# Patient Record
Sex: Female | Born: 1940 | Race: White | Hispanic: No | Marital: Married | Smoking: Never smoker
Health system: Southern US, Community
[De-identification: ages and names within clinical notes are randomized; demographics above are authoritative.]

## PROBLEM LIST (undated history)

## (undated) HISTORY — PX: ABDOMINAL HYSTERECTOMY: SHX81

## (undated) HISTORY — PX: BREAST BIOPSY: SHX20

---

## 1999-04-19 ENCOUNTER — Ambulatory Visit (HOSPITAL_BASED_OUTPATIENT_CLINIC_OR_DEPARTMENT_OTHER): Admission: RE | Admit: 1999-04-19 | Discharge: 1999-04-19 | Payer: Self-pay | Admitting: *Deleted

## 2000-02-21 ENCOUNTER — Encounter: Payer: Self-pay | Admitting: Family Medicine

## 2000-02-21 ENCOUNTER — Encounter: Admission: RE | Admit: 2000-02-21 | Discharge: 2000-02-21 | Payer: Self-pay | Admitting: Family Medicine

## 2000-04-10 ENCOUNTER — Other Ambulatory Visit: Admission: RE | Admit: 2000-04-10 | Discharge: 2000-04-10 | Payer: Self-pay | Admitting: Family Medicine

## 2001-03-12 ENCOUNTER — Encounter: Payer: Self-pay | Admitting: Family Medicine

## 2001-03-12 ENCOUNTER — Encounter: Admission: RE | Admit: 2001-03-12 | Discharge: 2001-03-12 | Payer: Self-pay | Admitting: Family Medicine

## 2003-05-26 ENCOUNTER — Encounter: Payer: Self-pay | Admitting: Family Medicine

## 2003-05-26 ENCOUNTER — Encounter: Admission: RE | Admit: 2003-05-26 | Discharge: 2003-05-26 | Payer: Self-pay | Admitting: Family Medicine

## 2003-06-16 ENCOUNTER — Other Ambulatory Visit: Admission: RE | Admit: 2003-06-16 | Discharge: 2003-06-16 | Payer: Self-pay | Admitting: Family Medicine

## 2003-09-11 ENCOUNTER — Encounter (INDEPENDENT_AMBULATORY_CARE_PROVIDER_SITE_OTHER): Payer: Self-pay | Admitting: Specialist

## 2003-09-11 ENCOUNTER — Ambulatory Visit (HOSPITAL_COMMUNITY): Admission: RE | Admit: 2003-09-11 | Discharge: 2003-09-11 | Payer: Self-pay | Admitting: Gastroenterology

## 2004-06-18 ENCOUNTER — Encounter: Admission: RE | Admit: 2004-06-18 | Discharge: 2004-06-18 | Payer: Self-pay | Admitting: Family Medicine

## 2004-08-13 ENCOUNTER — Other Ambulatory Visit: Admission: RE | Admit: 2004-08-13 | Discharge: 2004-08-13 | Payer: Self-pay | Admitting: Family Medicine

## 2005-08-19 ENCOUNTER — Encounter: Admission: RE | Admit: 2005-08-19 | Discharge: 2005-08-19 | Payer: Self-pay | Admitting: Family Medicine

## 2005-09-01 ENCOUNTER — Encounter: Admission: RE | Admit: 2005-09-01 | Discharge: 2005-09-01 | Payer: Self-pay | Admitting: Family Medicine

## 2006-08-17 ENCOUNTER — Other Ambulatory Visit: Admission: RE | Admit: 2006-08-17 | Discharge: 2006-08-17 | Payer: Self-pay | Admitting: Family Medicine

## 2006-08-31 ENCOUNTER — Encounter: Admission: RE | Admit: 2006-08-31 | Discharge: 2006-08-31 | Payer: Self-pay | Admitting: Family Medicine

## 2006-09-07 ENCOUNTER — Encounter: Admission: RE | Admit: 2006-09-07 | Discharge: 2006-09-07 | Payer: Self-pay | Admitting: Family Medicine

## 2008-02-04 ENCOUNTER — Encounter: Admission: RE | Admit: 2008-02-04 | Discharge: 2008-02-04 | Payer: Self-pay | Admitting: Family Medicine

## 2008-10-02 ENCOUNTER — Other Ambulatory Visit: Admission: RE | Admit: 2008-10-02 | Discharge: 2008-10-02 | Payer: Self-pay | Admitting: Family Medicine

## 2009-05-02 ENCOUNTER — Encounter: Admission: RE | Admit: 2009-05-02 | Discharge: 2009-05-02 | Payer: Self-pay | Admitting: Family Medicine

## 2010-07-01 ENCOUNTER — Encounter: Admission: RE | Admit: 2010-07-01 | Discharge: 2010-07-01 | Payer: Self-pay | Admitting: Family Medicine

## 2011-04-18 NOTE — Op Note (Signed)
   NAME:  Renee Rhodes, Renee Rhodes                          ACCOUNT NO.:  [id number]   MEDICAL RECORD NO.:  [id number]                   PATIENT TYPE:  AMB   LOCATION:  ENDO                                 FACILITY:  [facility]   PHYSICIAN:  Graylin Shiver, M.D.                DATE OF BIRTH:  10-31-1941   DATE OF PROCEDURE:  09/11/2003  DATE OF DISCHARGE:                                 OPERATIVE REPORT   PROCEDURE:  Colonoscopy with biopsy.   INDICATION FOR PROCEDURE:  Screening.   Informed consent was obtained after explanation of the risks of bleeding,  infection, and premedication.   PREMEDICATION:  Fentanyl 75 mcg IV, Versed 7 mg IV.   DESCRIPTION OF PROCEDURE:  With the patient in the left lateral decubitus  position, a rectal exam was performed and no masses were felt.  The Olympus  pediatric colonoscope was inserted into the rectum and advanced around the  colon to the cecum.  Cecal landmarks were identified.  The cecum and  ascending colon were normal.  At the level of the hepatic flexure there was  a fold that had a very slight granular appearance to it.  Initially I took a  biopsy forceps and rubbed across the fold, and the granularity flattened out  and disappeared but then as the fold came back, it was a slight granular  appearance.  Several biopsies of this fold were obtained for histologic  inspection.  I am unclear as to the significance of this at this time.  Biopsies will be checked.  The transverse colon looked normal.  The  descending colon and sigmoid revealed diverticulosis.  The rectum was  normal.  She tolerated the procedure well without complication.   IMPRESSION:  1. Diverticulosis.  2. Slight granularity of the fold at the level of the transverse colon,     biopsies were obtained for histology and will be checked.                                               Graylin Shiver, M.D.    SFG/MEDQ  D:  09/11/2003  T:  09/11/2003  Job:  [id number]   cc:   Gretta Arab.  Valentina Lucks, M.D.  [address] [place]  [place] [address]  Fax: [phone]

## 2011-07-11 ENCOUNTER — Other Ambulatory Visit: Payer: Self-pay | Admitting: Family Medicine

## 2011-07-11 DIAGNOSIS — Z1231 Encounter for screening mammogram for malignant neoplasm of breast: Secondary | ICD-10-CM

## 2011-07-24 ENCOUNTER — Ambulatory Visit
Admission: RE | Admit: 2011-07-24 | Discharge: 2011-07-24 | Disposition: A | Discharge status: Home / Self Care | Payer: Medicare Other | Source: Ambulatory Visit | Attending: Family Medicine | Admitting: Family Medicine

## 2011-07-24 DIAGNOSIS — Z1231 Encounter for screening mammogram for malignant neoplasm of breast: Secondary | ICD-10-CM

## 2012-08-24 ENCOUNTER — Other Ambulatory Visit: Payer: Self-pay | Admitting: Family Medicine

## 2012-08-24 DIAGNOSIS — Z1231 Encounter for screening mammogram for malignant neoplasm of breast: Secondary | ICD-10-CM

## 2012-09-09 ENCOUNTER — Ambulatory Visit
Admission: RE | Admit: 2012-09-09 | Discharge: 2012-09-09 | Disposition: A | Discharge status: Home / Self Care | Payer: 59 | Source: Ambulatory Visit | Attending: Family Medicine | Admitting: Family Medicine

## 2012-09-09 DIAGNOSIS — Z1231 Encounter for screening mammogram for malignant neoplasm of breast: Secondary | ICD-10-CM

## 2013-09-02 ENCOUNTER — Other Ambulatory Visit: Payer: Self-pay

## 2013-09-02 DIAGNOSIS — Z1231 Encounter for screening mammogram for malignant neoplasm of breast: Secondary | ICD-10-CM

## 2013-09-12 ENCOUNTER — Ambulatory Visit
Admission: RE | Admit: 2013-09-12 | Discharge: 2013-09-12 | Disposition: A | Discharge status: Home / Self Care | Payer: Medicare Other | Source: Ambulatory Visit

## 2013-09-12 DIAGNOSIS — Z1231 Encounter for screening mammogram for malignant neoplasm of breast: Secondary | ICD-10-CM

## 2013-10-18 ENCOUNTER — Other Ambulatory Visit: Payer: Self-pay | Admitting: Gastroenterology

## 2014-08-22 ENCOUNTER — Other Ambulatory Visit: Payer: Self-pay

## 2014-08-22 DIAGNOSIS — Z1231 Encounter for screening mammogram for malignant neoplasm of breast: Secondary | ICD-10-CM

## 2014-09-13 ENCOUNTER — Ambulatory Visit
Admission: RE | Admit: 2014-09-13 | Discharge: 2014-09-13 | Disposition: A | Discharge status: Home / Self Care | Payer: Medicare Other | Source: Ambulatory Visit

## 2014-09-13 ENCOUNTER — Encounter (INDEPENDENT_AMBULATORY_CARE_PROVIDER_SITE_OTHER): Payer: Self-pay

## 2014-09-13 DIAGNOSIS — Z1231 Encounter for screening mammogram for malignant neoplasm of breast: Secondary | ICD-10-CM

## 2015-01-02 ENCOUNTER — Emergency Department (HOSPITAL_COMMUNITY)
Admission: EM | Admit: 2015-01-02 | Discharge: 2015-01-02 | Disposition: A | Discharge status: Home / Self Care | Payer: Medicare Other | Source: Home / Self Care | Attending: Family Medicine | Admitting: Family Medicine

## 2015-01-02 ENCOUNTER — Encounter (HOSPITAL_COMMUNITY): Payer: Self-pay | Admitting: *Deleted

## 2015-01-02 ENCOUNTER — Emergency Department (INDEPENDENT_AMBULATORY_CARE_PROVIDER_SITE_OTHER): Payer: Medicare Other

## 2015-01-02 DIAGNOSIS — W19XXXA Unspecified fall, initial encounter: Secondary | ICD-10-CM

## 2015-01-02 DIAGNOSIS — S52121A Displaced fracture of head of right radius, initial encounter for closed fracture: Secondary | ICD-10-CM

## 2015-01-02 MED ORDER — HYDROCODONE-ACETAMINOPHEN 5-325 MG PO TABS: 1.0000 | ORAL_TABLET | Freq: Four times a day (QID) | ORAL | Status: DC | PRN

## 2015-01-02 MED ORDER — IBUPROFEN 800 MG PO TABS: 800.0000 mg | ORAL_TABLET | Freq: Three times a day (TID) | ORAL | Status: AC | PRN

## 2015-01-02 NOTE — ED Notes (Signed)
Pt  Renee Rhodes  Yesterday    And  Injured  Her  r  Arm      -  She  Has  Pain on  Palpation    And  Certain movements        She  Is  Alert  And  Oriented         Speaking in  Complete     sentances        Ice  Pack    Is   In place

## 2015-01-02 NOTE — ED Provider Notes (Signed)
CSN: [id number]     Arrival date & time 01/02/15  [id number] History   First MD Initiated Contact with Patient 01/02/15 [phone]     Chief Complaint  Patient presents with  . Arm Injury   (Consider location/radiation/quality/duration/timing/severity/associated sxs/prior Treatment) HPI   Patient here with complaints of right forearm pain. She explains that last night she stepped on a basketball in her living room and fell on her right forearm. She had some pain and swelling of the time which was helped by 800 mg of Motrin and ice. This morning she is continued to use ice and Motrin. She states that should difficulty with tasks this morning using her right hand including using soap dispenser, closing car door, and getting dressed. She has pain with movement and use of the right hand and feels that it's weak due to limitations of pain.  She denies Any head trauma or loss of consciousness. She also denies chest pain, dyspnea, fever, chills, or change in appetite or by mouth tolerance.  History reviewed. No pertinent past medical history. History reviewed. No pertinent past surgical history. History reviewed. No pertinent family history. History  Substance Use Topics  . Smoking status: Not on file  . Smokeless tobacco: Not on file  . Alcohol Use: Not on file   OB History    No data available     Review of Systems  Constitutional: Negative for fever and chills.  Eyes: Negative for visual disturbance.  Respiratory: Negative for shortness of breath.   Neurological: Negative for dizziness.    Allergies  Review of patient's allergies indicates no known allergies.  Home Medications   Prior to Admission medications   Not on File   BP 130/84 mmHg  Pulse 67  Temp(Src) 97.9 F (36.6 C) (Oral)  Resp 16  SpO2 97% Physical Exam  Constitutional: She appears well-developed and well-nourished. No distress.  HENT:  Head: Normocephalic and atraumatic.  Neck: Neck supple.  Cardiovascular: Normal  rate, regular rhythm and normal heart sounds.   No murmur heard. Normal pulse and right radial and ulnar arteries, brisk cap refill in right hand  Pulmonary/Chest: Effort normal and breath sounds normal. She has no wheezes.  Musculoskeletal: Normal range of motion.  Right forearm with no tenderness to palpation, pain in lateral and medial forearm with supination and pronation respectively, some mild pain in lateral portion of proximal forearm with flexion, full range of motion. No tenderness to palpation of any of the bony structures of the right hand, wrist, right forearm, or humerus.   Neurological:  Normal Sensation in right hand  Skin: She is not diaphoretic.    ED Course  Procedures (including critical care time) Labs Review Labs Reviewed - No data to display  Imaging Review Dg Forearm Right  01/02/2015   CLINICAL DATA:  Patient fell onto right forearm after tripping 1 day prior  EXAM: RIGHT FOREARM - 2 VIEW  COMPARISON:  None.  FINDINGS: Frontal and lateral views were obtained. There is an impacted fracture of the proximal radial metaphysis in near anatomic alignment. No other fracture. No dislocation. There is an elbow joint effusion. A small focus of calcification is seen in the volar aspect of the elbow joint. Incidental note is made of a minus ulnar variant.  IMPRESSION: Impacted fracture proximal radial metaphysis. No other fracture. No dislocation.   Electronically Signed   By: Bretta BangWilliam  Woodruff M.D.   On: 01/02/2015 09:58     MDM   1. Radial head fracture,  right, closed, initial encounter   2. Fall    74 year old female here with radial head fracture. Have discussed with he office of Dr. who will see her next Tuesday and asked that she placed in a posterior arms point here. Splint was applied by myself and Dr. Denyse Amass. Patient given ibuprofen and Norco for pain.   Pt seen and discussed with Dr. Artis Flock and he agrees with the plan as discussed above.      Renee Gamma,  MD 01/02/15 1040

## 2015-01-02 NOTE — ED Provider Notes (Signed)
CSN: [id number]     Arrival date & time 01/02/15  [id number] History   First MD Initiated Contact with Patient 01/02/15 [phone]     Chief Complaint  Patient presents with  . Arm Injury   (Consider location/radiation/quality/duration/timing/severity/associated sxs/prior Treatment) HPI  History reviewed. No pertinent past medical history. History reviewed. No pertinent past surgical history. History reviewed. No pertinent family history. History  Substance Use Topics  . Smoking status: Not on file  . Smokeless tobacco: Not on file  . Alcohol Use: Not on file   OB History    No data available     Review of Systems  Allergies  Review of patient's allergies indicates no known allergies.  Home Medications   Prior to Admission medications   Medication Sig Start Date End Date Taking? Authorizing Provider  HYDROcodone-acetaminophen (NORCO) 5-325 MG per tablet Take 1 tablet by mouth every 6 (six) hours as needed for moderate pain. 01/02/15   Elenora GammaSamuel L Bradshaw, MD  ibuprofen (ADVIL,MOTRIN) 800 MG tablet Take 1 tablet (800 mg total) by mouth every 8 (eight) hours as needed. 01/02/15   Elenora GammaSamuel L Bradshaw, MD   BP 130/84 mmHg  Pulse 67  Temp(Src) 97.9 F (36.6 C) (Oral)  Resp 16  SpO2 97% Physical Exam  ED Course  Procedures (including critical care time) Labs Review Labs Reviewed - No data to display  Imaging Review Dg Forearm Right  01/02/2015   CLINICAL DATA:  Patient fell onto right forearm after tripping 1 day prior  EXAM: RIGHT FOREARM - 2 VIEW  COMPARISON:  None.  FINDINGS: Frontal and lateral views were obtained. There is an impacted fracture of the proximal radial metaphysis in near anatomic alignment. No other fracture. No dislocation. There is an elbow joint effusion. A small focus of calcification is seen in the volar aspect of the elbow joint. Incidental note is made of a minus ulnar variant.  IMPRESSION: Impacted fracture proximal radial metaphysis. No other fracture. No  dislocation.   Electronically Signed   By: Bretta BangWilliam  Woodruff M.D.   On: 01/02/2015 09:58     MDM   1. Radial head fracture, right, closed, initial encounter   2. Fall    Pt seen and examined and x-ray reviewed with resident and plan reviewed.    Linna HoffJames D Kindl, MD 01/02/15 [phone]

## 2015-01-02 NOTE — ED Notes (Signed)
Med  r   Arm  Sling  applied

## 2015-01-02 NOTE — Discharge Instructions (Signed)

## 2015-01-29 ENCOUNTER — Emergency Department (HOSPITAL_COMMUNITY)
Admission: EM | Admit: 2015-01-29 | Discharge: 2015-01-29 | Disposition: A | Discharge status: Home / Self Care | Payer: Medicare Other | Source: Home / Self Care | Attending: Family Medicine | Admitting: Family Medicine

## 2015-01-29 ENCOUNTER — Encounter (HOSPITAL_COMMUNITY): Payer: Self-pay | Admitting: Family Medicine

## 2015-01-29 DIAGNOSIS — R6883 Chills (without fever): Secondary | ICD-10-CM

## 2015-01-29 DIAGNOSIS — S42301D Unspecified fracture of shaft of humerus, right arm, subsequent encounter for fracture with routine healing: Secondary | ICD-10-CM

## 2015-01-29 DIAGNOSIS — IMO0001 Reserved for inherently not codable concepts without codable children: Secondary | ICD-10-CM

## 2015-01-29 DIAGNOSIS — K219 Gastro-esophageal reflux disease without esophagitis: Secondary | ICD-10-CM

## 2015-01-29 NOTE — Discharge Instructions (Signed)
The cause of your symptoms is not immediately clear may be due to several factors - stress, healing right arm, and an early viral infection. Please continue your reflux medicine as needed to prevent heartburn. Please use the Motrin as needed for symptomatic chills or general ill feeling. Please come back for follow-up with your primary care physician if you do develop fevers and worsening chills, aches, pains. Please make sure to take plenty of time for yourself to help reduce stress.

## 2015-01-29 NOTE — ED Provider Notes (Signed)
CSN: [id number]     Arrival date & time 01/29/15  1102 History   First MD Initiated Contact with Patient 01/29/15 1222     Chief Complaint  Patient presents with  . Chills  . Shaking  . Gastrophageal Reflux   (Consider location/radiation/quality/duration/timing/severity/associated sxs/prior Treatment) HPI  Last night awoke at 04:30 with chills and whole body shaking. Took Motrin 800 and used more blankets and was able to get to sleep. Did not eat anything. Completely resolved by time pt awoke and pts husband told pt to come to [facility].   Developed burning in esophagus. Ate very late. Associated w/ cough. Did not take prilosec last night.   History reviewed. No pertinent past medical history. Past Surgical History  Procedure Laterality Date  . Abdominal hysterectomy     Family History  Problem Relation Age of Onset  . Dementia Mother   . Hypertension Father    History  Substance Use Topics  . Smoking status: Never Smoker   . Smokeless tobacco: Never Used  . Alcohol Use: No   OB History    No data available     Review of Systems Per HPI with all other pertinent systems negative.   Allergies  Review of patient's allergies indicates no known allergies.  Home Medications   Prior to Admission medications   Medication Sig Start Date End Date Taking? Authorizing Provider  atorvastatin (LIPITOR) 20 MG tablet Take 20 mg by mouth daily.   Yes Historical Provider, MD  calcium carbonate (OS-CAL) 1250 (500 CA) MG chewable tablet Chew 1 tablet by mouth daily.   Yes Historical Provider, MD  omeprazole (PRILOSEC) 10 MG capsule Take 10 mg by mouth daily.   Yes Historical Provider, MD  ibuprofen (ADVIL,MOTRIN) 800 MG tablet Take 1 tablet (800 mg total) by mouth every 8 (eight) hours as needed. 01/02/15   Elenora Gamma, MD   BP 143/73 mmHg  Pulse 77  Temp(Src) 97.8 F (36.6 C) (Oral)  Resp 16  SpO2 100% Physical Exam  Constitutional: She is oriented to person, place, and time. She  appears well-developed and well-nourished.  HENT:  Head: Normocephalic and atraumatic.  Eyes: EOM are normal. Pupils are equal, round, and reactive to light.  Neck: Normal range of motion.  Cardiovascular: Normal rate.   No murmur heard. Pulmonary/Chest: Effort normal and breath sounds normal.  Abdominal: Soft. Bowel sounds are normal.  Musculoskeletal:  Right arm held in flexed position. Hand with full range of motion and 4-5 grip strength. Left hand 5/5 grip strength in left arm with full range of motion. Radial pulses 2+ bilateral  Neurological: She is alert and oriented to person, place, and time.  Skin: Skin is warm.  Psychiatric: She has a normal mood and affect. Her behavior is normal.    ED Course  Procedures (including critical care time) Labs Review Labs Reviewed - No data to display  Imaging Review No results found.   MDM   1. Right arm fracture, with routine healing, subsequent encounter   2. Chills (without fever)   3. Reflux    Well-healing right arm fracture in continue follow-up with work.  Reflux esophagitis. Patient to resume normal PPI.  Single episode of chills shaking and general ill feeling. Resolved at time of visit. Symptom possibly multifactorial secondary to very brief or early viral illness, stress, acute inflammatory reaction secondary to right arm fracture and healing process. No further intervention at this time unless symptoms recur and worsen.  Precautions given and  all questions answered  Shelly Flattenavid Merrell, MD Family Medicine 01/29/2015, 12:42 PM      Ozella Rocksavid J Merrell, MD 01/29/15 [phone]

## 2015-01-29 NOTE — ED Notes (Signed)
Pt was experiencing extreme GERD last night that kept her awake.  She then started shaking all over and had chills.  She took some Ibuprofen and felt better.

## 2015-09-04 ENCOUNTER — Other Ambulatory Visit: Payer: Self-pay

## 2015-09-04 DIAGNOSIS — Z1231 Encounter for screening mammogram for malignant neoplasm of breast: Secondary | ICD-10-CM

## 2015-09-17 ENCOUNTER — Ambulatory Visit
Admission: RE | Admit: 2015-09-17 | Discharge: 2015-09-17 | Disposition: A | Discharge status: Home / Self Care | Payer: Medicare Other | Source: Ambulatory Visit

## 2015-09-17 DIAGNOSIS — Z1231 Encounter for screening mammogram for malignant neoplasm of breast: Secondary | ICD-10-CM

## 2016-09-19 ENCOUNTER — Other Ambulatory Visit: Payer: Self-pay | Admitting: Family Medicine

## 2016-09-19 DIAGNOSIS — Z1231 Encounter for screening mammogram for malignant neoplasm of breast: Secondary | ICD-10-CM

## 2016-09-30 ENCOUNTER — Ambulatory Visit
Admission: RE | Admit: 2016-09-30 | Discharge: 2016-09-30 | Disposition: A | Discharge status: Home / Self Care | Payer: Medicare Other | Source: Ambulatory Visit | Attending: Family Medicine | Admitting: Family Medicine

## 2016-09-30 DIAGNOSIS — Z1231 Encounter for screening mammogram for malignant neoplasm of breast: Secondary | ICD-10-CM

## 2017-09-28 ENCOUNTER — Other Ambulatory Visit: Payer: Self-pay | Admitting: Family Medicine

## 2017-09-28 DIAGNOSIS — Z139 Encounter for screening, unspecified: Secondary | ICD-10-CM

## 2017-10-27 ENCOUNTER — Ambulatory Visit
Admission: RE | Admit: 2017-10-27 | Discharge: 2017-10-27 | Disposition: A | Discharge status: Home / Self Care | Payer: Medicare Other | Source: Ambulatory Visit | Attending: Family Medicine | Admitting: Family Medicine

## 2017-10-27 DIAGNOSIS — Z139 Encounter for screening, unspecified: Secondary | ICD-10-CM

## 2018-11-30 ENCOUNTER — Ambulatory Visit
Admission: RE | Admit: 2018-11-30 | Discharge: 2018-11-30 | Disposition: A | Discharge status: Home / Self Care | Payer: Medicare Other | Source: Ambulatory Visit | Attending: Family Medicine | Admitting: Family Medicine

## 2018-11-30 ENCOUNTER — Other Ambulatory Visit: Payer: Self-pay | Admitting: Family Medicine

## 2018-11-30 DIAGNOSIS — Z1231 Encounter for screening mammogram for malignant neoplasm of breast: Secondary | ICD-10-CM

## 2018-12-02 ENCOUNTER — Other Ambulatory Visit: Payer: Self-pay | Admitting: Family Medicine

## 2018-12-02 DIAGNOSIS — R928 Other abnormal and inconclusive findings on diagnostic imaging of breast: Secondary | ICD-10-CM

## 2018-12-09 ENCOUNTER — Ambulatory Visit: Payer: Medicare Other

## 2018-12-09 ENCOUNTER — Ambulatory Visit
Admission: RE | Admit: 2018-12-09 | Discharge: 2018-12-09 | Disposition: A | Discharge status: Home / Self Care | Payer: Medicare Other | Source: Ambulatory Visit | Attending: Family Medicine | Admitting: Family Medicine

## 2018-12-09 DIAGNOSIS — R928 Other abnormal and inconclusive findings on diagnostic imaging of breast: Secondary | ICD-10-CM

## 2019-08-04 ENCOUNTER — Other Ambulatory Visit: Payer: Self-pay | Admitting: Family Medicine

## 2019-08-04 DIAGNOSIS — M810 Age-related osteoporosis without current pathological fracture: Secondary | ICD-10-CM

## 2019-10-06 ENCOUNTER — Other Ambulatory Visit: Payer: Self-pay | Admitting: Family Medicine

## 2019-10-06 DIAGNOSIS — I739 Peripheral vascular disease, unspecified: Secondary | ICD-10-CM

## 2019-10-10 ENCOUNTER — Ambulatory Visit
Admission: RE | Admit: 2019-10-10 | Discharge: 2019-10-10 | Disposition: A | Discharge status: Home / Self Care | Payer: Medicare Other | Source: Ambulatory Visit | Attending: Family Medicine | Admitting: Family Medicine

## 2019-10-10 DIAGNOSIS — I739 Peripheral vascular disease, unspecified: Secondary | ICD-10-CM

## 2019-10-13 ENCOUNTER — Other Ambulatory Visit: Payer: Self-pay

## 2019-10-13 ENCOUNTER — Ambulatory Visit
Admission: RE | Admit: 2019-10-13 | Discharge: 2019-10-13 | Disposition: A | Discharge status: Home / Self Care | Payer: Medicare Other | Source: Ambulatory Visit | Attending: Family Medicine | Admitting: Family Medicine

## 2019-10-13 DIAGNOSIS — M810 Age-related osteoporosis without current pathological fracture: Secondary | ICD-10-CM

## 2019-12-07 ENCOUNTER — Other Ambulatory Visit: Payer: Self-pay | Admitting: Family Medicine

## 2019-12-07 DIAGNOSIS — Z1231 Encounter for screening mammogram for malignant neoplasm of breast: Secondary | ICD-10-CM

## 2019-12-15 ENCOUNTER — Ambulatory Visit
Admission: RE | Admit: 2019-12-15 | Discharge: 2019-12-15 | Disposition: A | Discharge status: Home / Self Care | Payer: Medicare Other | Source: Ambulatory Visit | Attending: Family Medicine | Admitting: Family Medicine

## 2019-12-15 ENCOUNTER — Other Ambulatory Visit: Payer: Self-pay

## 2019-12-15 DIAGNOSIS — Z1231 Encounter for screening mammogram for malignant neoplasm of breast: Secondary | ICD-10-CM

## 2020-04-24 ENCOUNTER — Other Ambulatory Visit: Payer: Self-pay

## 2020-04-24 ENCOUNTER — Ambulatory Visit (INDEPENDENT_AMBULATORY_CARE_PROVIDER_SITE_OTHER): Payer: Medicare Other | Admitting: Podiatry

## 2020-04-24 DIAGNOSIS — Q828 Other specified congenital malformations of skin: Secondary | ICD-10-CM

## 2020-04-24 DIAGNOSIS — M21621 Bunionette of right foot: Secondary | ICD-10-CM | POA: Diagnosis not present

## 2020-04-24 DIAGNOSIS — B351 Tinea unguium: Secondary | ICD-10-CM

## 2020-04-24 DIAGNOSIS — Z79899 Other long term (current) drug therapy: Secondary | ICD-10-CM | POA: Diagnosis not present

## 2020-04-24 LAB — CBC WITH DIFFERENTIAL/PLATELET
Absolute Monocytes: 714 cells/uL (ref 200–950)
Basophils Absolute: 72 cells/uL (ref 0–200)
Basophils Relative: 1.2 %
Eosinophils Absolute: 192 cells/uL (ref 15–500)
Eosinophils Relative: 3.2 %
HCT: 41.6 % (ref 35.0–45.0)
Hemoglobin: 13.3 g/dL (ref 11.7–15.5)
Lymphs Abs: 1662 cells/uL (ref 850–3900)
MCH: 28.8 pg (ref 27.0–33.0)
MCHC: 32 g/dL (ref 32.0–36.0)
MCV: 90 fL (ref 80.0–100.0)
MPV: 9.8 fL (ref 7.5–12.5)
Monocytes Relative: 11.9 %
Neutro Abs: 3360 cells/uL (ref 1500–7800)
Neutrophils Relative %: 56 %
Platelets: 286 10*3/uL (ref 140–400)
RBC: 4.62 10*6/uL (ref 3.80–5.10)
RDW: 13.2 % (ref 11.0–15.0)
Total Lymphocyte: 27.7 %
WBC: 6 10*3/uL (ref 3.8–10.8)

## 2020-04-24 LAB — HEPATIC FUNCTION PANEL
AG Ratio: 1.9 (calc) (ref 1.0–2.5)
ALT: 19 U/L (ref 6–29)
AST: 19 U/L (ref 10–35)
Albumin: 4 g/dL (ref 3.6–5.1)
Alkaline phosphatase (APISO): 71 U/L (ref 37–153)
Bilirubin, Direct: 0.1 mg/dL (ref 0.0–0.2)
Globulin: 2.1 g/dL (calc) (ref 1.9–3.7)
Indirect Bilirubin: 0.2 mg/dL (calc) (ref 0.2–1.2)
Total Bilirubin: 0.3 mg/dL (ref 0.2–1.2)
Total Protein: 6.1 g/dL (ref 6.1–8.1)

## 2020-04-24 NOTE — Patient Instructions (Signed)
Terbinafine oral granules What is this medicine? TERBINAFINE (TER bin a feen) is an antifungal medicine. It is used to treat certain kinds of fungal or yeast infections. This medicine may be used for other purposes; ask your health care provider or pharmacist if you have questions. COMMON BRAND NAME(S): Lamisil What should I tell my health care provider before I take this medicine? They need to know if you have any of these conditions:  drink alcoholic beverages  kidney disease  liver disease  an unusual or allergic reaction to Terbinafine, other medicines, foods, dyes, or preservatives  pregnant or trying to get pregnant  breast-feeding How should I use this medicine? Take this medicine by mouth. Follow the directions on the prescription label. Hold packet with cut line on top. Shake packet gently to settle contents. Tear packet open along cut line, or use scissors to cut across line. Carefully pour the entire contents of packet onto a spoonful of a soft food, such as pudding or other soft, non-acidic food such as mashed potatoes (do NOT use applesauce or a fruit-based food). If two packets are required for each dose, you may either sprinkle the content of both packets on one spoonful of non-acidic food, or sprinkle the contents of both packets on two spoonfuls of non-acidic food. Make sure that no granules remain in the packet. Swallow the mxiture of the food and granules without chewing. Take your medicine at regular intervals. Do not take it more often than directed. Take all of your medicine as directed even if you think you are better. Do not skip doses or stop your medicine early. Contact your pediatrician or health care professional regarding the use of this medicine in children. While this medicine may be prescribed for children as young as 4 years for selected conditions, precautions do apply. Overdosage: If you think you have taken too much of this medicine contact a poison control  center or emergency room at once. NOTE: This medicine is only for you. Do not share this medicine with others. What if I miss a dose? If you miss a dose, take it as soon as you can. If it is almost time for your next dose, take only that dose. Do not take double or extra doses. What may interact with this medicine? Do not take this medicine with any of the following medications:  thioridazine This medicine may also interact with the following medications:  beta-blockers  caffeine  cimetidine  cyclosporine  MAOIs like Carbex, Eldepryl, Marplan, Nardil, and Parnate  medicines for fungal infections like fluconazole and ketoconazole  medicines for irregular heartbeat like amiodarone, flecainide and propafenone  rifampin  SSRIs like citalopram, escitalopram, fluoxetine, fluvoxamine, paroxetine and sertraline  tricyclic antidepressants like amitriptyline, clomipramine, desipramine, imipramine, nortriptyline, and others  warfarin This list may not describe all possible interactions. Give your health care provider a list of all the medicines, herbs, non-prescription drugs, or dietary supplements you use. Also tell them if you smoke, drink alcohol, or use illegal drugs. Some items may interact with your medicine. What should I watch for while using this medicine? Your doctor may monitor your liver function. Tell your doctor right away if you have nausea or vomiting, loss of appetite, stomach pain on your right upper side, yellow skin, dark urine, light stools, or are over tired. This medicine may cause serious skin reactions. They can happen weeks to months after starting the medicine. Contact your health care provider right away if you notice fevers or flu-like symptoms   with a rash. The rash may be red or purple and then turn into blisters or peeling of the skin. Or, you might notice a red rash with swelling of the face, lips or lymph nodes in your neck or under your arms. You need to take  this medicine for 6 weeks or longer to cure the fungal infection. Take your medicine regularly for as long as your doctor or health care provider tells you to. What side effects may I notice from receiving this medicine? Side effects that you should report to your doctor or health care professional as soon as possible:  allergic reactions like skin rash or hives, swelling of the face, lips, or tongue  change in vision  dark urine  fever or infection  general ill feeling or flu-like symptoms  light-colored stools  loss of appetite, nausea  rash, fever, and swollen lymph nodes  redness, blistering, peeling or loosening of the skin, including inside the mouth  right upper belly pain  unusually weak or tired  yellowing of the eyes or skin Side effects that usually do not require medical attention (report to your doctor or health care professional if they continue or are bothersome):  changes in taste  diarrhea  hair loss  muscle or joint pain  stomach upset This list may not describe all possible side effects. Call your doctor for medical advice about side effects. You may report side effects to FDA at 1-800-FDA-1088. Where should I keep my medicine? Keep out of the reach of children. Store at room temperature between 15 and 30 degrees C (59 and 86 degrees F). Throw away any unused medicine after the expiration date. NOTE: This sheet is a summary. It may not cover all possible information. If you have questions about this medicine, talk to your doctor, pharmacist, or health care provider.  2020 Elsevier/Gold Standard (2019-02-25 15:35:11)  

## 2020-05-01 ENCOUNTER — Other Ambulatory Visit: Payer: Self-pay | Admitting: Podiatry

## 2020-05-01 MED ORDER — TERBINAFINE HCL 250 MG PO TABS: 250.0000 mg | ORAL_TABLET | Freq: Every day | ORAL | 0 refills | Status: AC

## 2020-05-01 NOTE — Progress Notes (Signed)
Subjective:   Patient ID: Renee Rhodes, female   DOB: 79 y.o.   MRN: [id number]   HPI 79 year old female presents the office today for concerns of her toenails becoming discolored and occasional follow-up present on the past 2 years.  Denies any dark discoloration or any black streaks.  No redness or drainage or any signs of infection she reports.  She also gets a skin lesion on the right foot submetatarsal 5 and she points to which occasion becomes sharp.  Swelling about 6 months.  No recent injury.  No other concerns.   Review of Systems  All other systems reviewed and are negative.  No past medical history on file.  Past Surgical History:  Procedure Laterality Date  . ABDOMINAL HYSTERECTOMY    . BREAST BIOPSY Left    No Scar      Current Outpatient Medications:  .  alendronate (FOSAMAX) 70 MG tablet, Take 70 mg by mouth once a week., Disp: , Rfl:  .  ALPHAGAN P 0.1 % SOLN, , Disp: , Rfl:  .  atorvastatin (LIPITOR) 20 MG tablet, Take 20 mg by mouth daily., Disp: , Rfl:  .  calcium carbonate (OS-CAL) 1250 (500 CA) MG chewable tablet, Chew 1 tablet by mouth daily., Disp: , Rfl:  .  dorzolamide (TRUSOPT) 2 % ophthalmic solution, , Disp: , Rfl:  .  ibuprofen (ADVIL,MOTRIN) 800 MG tablet, Take 1 tablet (800 mg total) by mouth every 8 (eight) hours as needed., Disp: 20 tablet, Rfl: 0 .  omeprazole (PRILOSEC) 10 MG capsule, Take 10 mg by mouth daily., Disp: , Rfl:  .  SIMBRINZA 1-0.2 % SUSP, Instill 1 drop into both eyes twice a day, Disp: , Rfl:   Allergies  Allergen Reactions  . No Known Allergies          Objective:  Physical Exam  General: AAO x3, NAD  Dermatological: Nails appear mildly hypertrophic, dystrophic with yellow-brown discoloration.  There is no hyperpigmentation.  No open lesions.  Hyperkeratotic lesion right foot submetatarsal 5.  No ongoing ulceration drainage or foreign body or signs of infection.  Vascular: Dorsalis Pedis artery and Posterior  Tibial artery pedal pulses are 2/4 bilateral with immedate capillary fill time. There is no pain with calf compression, swelling, warmth, erythema.   Neruologic: Grossly intact via light touch bilateral.   Musculoskeletal: Tailors bunion present. Muscular strength 5/5 in all groups tested bilateral.  Gait: Unassisted, Nonantalgic.       Assessment:   79 year old female onychomycosis, hyperkeratotic lesion     Plan:  -Treatment options discussed including all alternatives, risks, and complications -Etiology of symptoms were discussed -Discussed treatment options for nail fungus.  After discussion she also proceed with oral therapy.  I ordered a CBC LFT to check prior to ordering Lamisil.  Discussed side effects. -Debrided hyperkeratotic tissue with any complications or bleeding.  Moisturizer and offloading.  Return in about 6 weeks (around 06/05/2020).  Vivi Barrack DPM

## 2020-05-02 ENCOUNTER — Telehealth: Payer: Self-pay | Admitting: *Deleted

## 2020-05-02 NOTE — Telephone Encounter (Signed)
-----   Message from Vivi Barrack, DPM sent at 05/01/2020  5:31 PM EDT ----- Misty Stanley- Please let her know that the blood work is normal and I have called in Lamisil for her. Thanks.

## 2020-05-02 NOTE — Telephone Encounter (Signed)
Called and left a message for the patient this morning and relayed the message per Dr Ardelle Anton and stated to call the office if any concerns or questions. Misty Stanley

## 2020-05-07 ENCOUNTER — Telehealth: Payer: Self-pay | Admitting: Podiatry

## 2020-05-07 NOTE — Telephone Encounter (Signed)
Pt was prescribed Oral Lamisil but her insurance will not cover it and is going to cost around $700. She is not able to afford the medication and would like to start the topical cream instead.   Please give patient a call to confirm the change.

## 2020-05-08 MED ORDER — NONFORMULARY OR COMPOUNDED ITEM: 5 refills | Status: AC

## 2020-05-08 NOTE — Telephone Encounter (Signed)
Left message informing pt I would send the orders for the topical to [place] [phone], they would process her insurance and call with coverage and delivery information. Faxed orders to [facility].

## 2020-05-08 NOTE — Addendum Note (Signed)
Addended by: Alphia Kava D on: 05/08/2020 01:57 PM   Modules accepted: Orders

## 2020-06-12 ENCOUNTER — Ambulatory Visit: Payer: Medicare Other | Admitting: Podiatry

## 2020-12-10 ENCOUNTER — Other Ambulatory Visit: Payer: Self-pay | Admitting: Family Medicine

## 2020-12-10 DIAGNOSIS — Z1231 Encounter for screening mammogram for malignant neoplasm of breast: Secondary | ICD-10-CM

## 2021-01-17 ENCOUNTER — Inpatient Hospital Stay: Admission: RE | Admit: 2021-01-17 | Payer: Medicare Other | Source: Ambulatory Visit

## 2021-03-08 ENCOUNTER — Other Ambulatory Visit: Payer: Self-pay

## 2021-03-08 ENCOUNTER — Ambulatory Visit
Admission: RE | Admit: 2021-03-08 | Discharge: 2021-03-08 | Disposition: A | Discharge status: Home / Self Care | Payer: Medicare Other | Source: Ambulatory Visit | Attending: Family Medicine | Admitting: Family Medicine

## 2021-03-08 DIAGNOSIS — Z1231 Encounter for screening mammogram for malignant neoplasm of breast: Secondary | ICD-10-CM

## 2021-04-15 ENCOUNTER — Ambulatory Visit (INDEPENDENT_AMBULATORY_CARE_PROVIDER_SITE_OTHER): Payer: Medicare Other | Admitting: Podiatry

## 2021-04-15 ENCOUNTER — Other Ambulatory Visit: Payer: Self-pay

## 2021-04-15 DIAGNOSIS — B351 Tinea unguium: Secondary | ICD-10-CM

## 2021-04-15 MED ORDER — EFINACONAZOLE 10 % EX SOLN: 1.0000 [drp] | Freq: Every day | CUTANEOUS | 11 refills | Status: AC

## 2021-04-15 NOTE — Patient Instructions (Signed)
Efinaconazole Topical Solution What is this medicine? EFINACONAZOLE (e FEE na KON a zole) is an antifungal medicine. It is used to treat certain kinds of fungal infections of the toenail. This medicine may be used for other purposes; ask your health care provider or pharmacist if you have questions. COMMON BRAND NAME(S): JUBLIA What should I tell my health care provider before I take this medicine? They need to know if you have any of these conditions:  an unusual or allergic reaction to efinaconazole, other medicines, foods, dyes or preservatives  pregnant or trying to get pregnant  breast-feeding How should I use this medicine? This medicine is for external use only. Do not take by mouth. Follow the directions on the label. Wash hands before and after use. Apply this medicine using the provided brush to cover the entire toenail. Do not use your medicine more often than directed. Finish the full course prescribed by your doctor or health care professional even if you think your condition is better. Do not stop using except on the advice of your doctor or health care professional. Talk to your pediatrician regarding the use of this medicine in children. While this drug may be prescribed for children as young as 6 years for selected conditions, precautions do apply. Overdosage: If you think you have taken too much of this medicine contact a poison control center or emergency room at once. NOTE: This medicine is only for you. Do not share this medicine with others. What if I miss a dose? If you miss a dose, use it as soon as you can. If it is almost time for your next dose, use only that dose. Do not use double or extra doses. What may interact with this medicine? Interactions have not been studied. Do not use any other nail products (i.e., nail polish, pedicures) during treatment with this medicine. This list may not describe all possible interactions. Give your health care provider a list of  all the medicines, herbs, non-prescription drugs, or dietary supplements you use. Also tell them if you smoke, drink alcohol, or use illegal drugs. Some items may interact with your medicine. What should I watch for while using this medicine? Do not get this medicine in your eyes. If you do, rinse out with plenty of cool tap water. Tell your doctor or health care professional if your symptoms do not start to get better or if they get worse. Wait for at least 10 minutes after bathing before applying this medication. After bathing, make sure that your feet are very dry. Fungal infections like moist conditions. Do not walk around barefoot. To help prevent reinfection, wear freshly washed cotton, not synthetic clothing. Tell your doctor or health care professional if you develop sores or blisters that do not heal properly. If your nail infection returns after you stop using this medicine, contact your doctor or health care professional. What side effects may I notice from receiving this medicine? Side effects that you should report to your doctor or health care professional as soon as possible:  allergic reactions like skin rash, itching or hives, swelling of the face, lips, or tongue  ingrown toenail Side effects that usually do not require medical attention (report to your doctor or health care professional if they continue or are bothersome):  mild skin irritation, burning, or itching This list may not describe all possible side effects. Call your doctor for medical advice about side effects. You may report side effects to FDA at 1-800-FDA-1088. Where should I   keep my medicine? Keep out of the reach of children. Store at room temperature between 20 and 25 degrees C (68 and 77 degrees F). Keep this medicine in the original container. Throw away any unused medicine after the expiration date. This medicine is flammable. Avoid exposure to heat, fire, flame, and smoking. NOTE: This sheet is a summary.  It may not cover all possible information. If you have questions about this medicine, talk to your doctor, pharmacist, or health care provider.  2021 Elsevier/Gold Standard (2019-03-28 16:14:11)  

## 2021-04-17 DIAGNOSIS — B351 Tinea unguium: Secondary | ICD-10-CM | POA: Insufficient documentation

## 2021-04-17 NOTE — Progress Notes (Signed)
Subjective: 80 year old female presents the office today for Evaluation of onychomycosis.  She was on Lamisil which helped minimally.  Nails are still discolored and asked about other treatment options.  Denies any redness or drainage of the toenail sites. Denies any systemic complaints such as fevers, chills, nausea, vomiting. No acute changes since last appointment, and no other complaints at this time.   Objective: AAO x3, NAD DP/PT pulses palpable bilaterally, CRT less than 3 seconds Overall the nails still appear to be hypertrophic, dystrophic, discolored with yellow-brown discoloration.  They are mildly hypertrophic.  No pain in the nails there is no redness or drainage or any signs of infection.  No open lesions. No pain with calf compression, swelling, warmth, erythema  Assessment: Onychomycosis  Plan: -All treatment options discussed with the patient including all alternatives, risks, complications.  -Discussed other treatment options other than oral Lamisil.  After discussion was proceed with Jublia which I ordered for her today.  Discussed side effects, success rates and duration of use. -Patient encouraged to call the office with any questions, concerns, change in symptoms.   Vivi Barrack DPM

## 2021-08-21 ENCOUNTER — Other Ambulatory Visit: Payer: Self-pay

## 2021-08-21 ENCOUNTER — Ambulatory Visit (INDEPENDENT_AMBULATORY_CARE_PROVIDER_SITE_OTHER): Payer: Medicare Other

## 2021-08-21 ENCOUNTER — Encounter: Payer: Self-pay | Admitting: Urgent Care

## 2021-08-21 ENCOUNTER — Ambulatory Visit
Admission: EM | Admit: 2021-08-21 | Discharge: 2021-08-21 | Disposition: A | Discharge status: Home / Self Care | Payer: Medicare Other | Attending: Urgent Care | Admitting: Urgent Care

## 2021-08-21 DIAGNOSIS — S62644A Nondisplaced fracture of proximal phalanx of right ring finger, initial encounter for closed fracture: Secondary | ICD-10-CM | POA: Diagnosis not present

## 2021-08-21 DIAGNOSIS — M79641 Pain in right hand: Secondary | ICD-10-CM | POA: Diagnosis not present

## 2021-08-21 DIAGNOSIS — M79644 Pain in right finger(s): Secondary | ICD-10-CM

## 2021-08-21 DIAGNOSIS — W19XXXA Unspecified fall, initial encounter: Secondary | ICD-10-CM

## 2021-08-21 MED ORDER — TRAMADOL HCL 50 MG PO TABS: 50.0000 mg | ORAL_TABLET | Freq: Four times a day (QID) | ORAL | 0 refills | Status: AC | PRN

## 2021-08-21 NOTE — Discharge Instructions (Addendum)
Make sure you make an appointment to see [facility] specialist at [facility].  If they are not able to see relatively soon then please make an appointment with a different hand specialist, I provided you with other practices to follow-up with.  Make sure you keep the splint on.  You can change out the Ace wrap if necessary.  Please just use Tylenol at a dose of 500mg -650mg  once every 6 hours as needed for your aches, pains, fevers. Do not use any nonsteroidal anti-inflammatories (NSAIDs) like ibuprofen, Motrin, naproxen, Aleve, etc. which are all available over-the-counter. You can use Tramadol for severe pain if Tylenol is not helping you.

## 2021-08-21 NOTE — ED Provider Notes (Signed)
[facility]   MRN: [id number] DOB: 19-Aug-1941  Subjective:   Renee Rhodes is a 80 y.o. female presenting for suffering a fall on an out-stretched right hand. The fall was accidental, was trying to feed her dog and tripped falling, absorbing the impact on her right hand. Denies head injury, loc, confusion, loss of sensation. Took 800mg  ibuprofen and came straight to our clinic.    No current facility-administered medications for this encounter.  Current Outpatient Medications:    alendronate (FOSAMAX) 70 MG tablet, Take 70 mg by mouth once a week., Disp: , Rfl:    ALPHAGAN P 0.1 % SOLN, , Disp: , Rfl:    atorvastatin (LIPITOR) 20 MG tablet, Take 20 mg by mouth daily., Disp: , Rfl:    calcium carbonate (OS-CAL) 1250 (500 CA) MG chewable tablet, Chew 1 tablet by mouth daily., Disp: , Rfl:    dorzolamide (TRUSOPT) 2 % ophthalmic solution, , Disp: , Rfl:    Efinaconazole 10 % SOLN, Apply 1 drop topically daily., Disp: 4 mL, Rfl: 11   Efinaconazole 10 % SOLN, Apply 1 drop topically daily., Disp: 4 mL, Rfl: 11   ibuprofen (ADVIL,MOTRIN) 800 MG tablet, Take 1 tablet (800 mg total) by mouth every 8 (eight) hours as needed., Disp: 20 tablet, Rfl: 0   NONFORMULARY OR COMPOUNDED ITEM, [facility]:  Antifungal topical - Terbinafine 3%, Fluconazole 2%, Tea Tree Oil 5%, Ibuprofen 2% in DMSO Suspension #33ml. Apply to the affected toenail(s) once (at bedtime) or twice daily., Disp: 30 each, Rfl: 5   omeprazole (PRILOSEC) 10 MG capsule, Take 10 mg by mouth daily., Disp: , Rfl:    SIMBRINZA 1-0.2 % SUSP, Instill 1 drop into both eyes twice a day, Disp: , Rfl:    terbinafine (LAMISIL) 250 MG tablet, Take 1 tablet (250 mg total) by mouth daily., Disp: 90 tablet, Rfl: 0   Allergies  Allergen Reactions   No Known Allergies     History reviewed. No pertinent past medical history.   Past Surgical History:  Procedure Laterality Date   ABDOMINAL HYSTERECTOMY     BREAST BIOPSY  Left    No Scar     Family History  Problem Relation Age of Onset   Dementia Mother    Hypertension Father    Breast cancer Neg Hx     Social History   Tobacco Use   Smoking status: Never   Smokeless tobacco: Never  Substance Use Topics   Alcohol use: No   Drug use: No    ROS   Objective:   Vitals: BP (!) 190/89 (BP Location: Left Arm)   Pulse 75   Temp 97.9 F (36.6 C) (Oral)   Resp 16   SpO2 96%   Physical Exam Constitutional:      General: She is not in acute distress.    Appearance: Normal appearance. She is well-developed. She is not ill-appearing, toxic-appearing or diaphoretic.  HENT:     Head: Normocephalic and atraumatic.     Nose: Nose normal.     Mouth/Throat:     Mouth: Mucous membranes are moist.     Pharynx: Oropharynx is clear.  Eyes:     General: No scleral icterus.       Right eye: No discharge.        Left eye: No discharge.     Extraocular Movements: Extraocular movements intact.     Conjunctiva/sclera: Conjunctivae normal.     Pupils: Pupils are equal, round, and reactive to  light.  Cardiovascular:     Rate and Rhythm: Normal rate.  Pulmonary:     Effort: Pulmonary effort is normal.  Musculoskeletal:       Hands:  Skin:    General: Skin is warm and dry.  Neurological:     General: No focal deficit present.     Mental Status: She is alert and oriented to person, place, and time.  Psychiatric:        Mood and Affect: Mood normal.        Behavior: Behavior normal.        Thought Content: Thought content normal.        Judgment: Judgment normal.    DG Hand Complete Right  Result Date: 08/21/2021 CLINICAL DATA:  Larey Seat and injured right hand today. EXAM: RIGHT HAND - COMPLETE 3+ VIEW COMPARISON:  None. FINDINGS: There is a nondisplaced oblique coursing fracture involving the proximal phalanx of the right ring finger. No intra-articular involvement is identified. No other fractures are identified. 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 IMPRESSION: Nondisplaced oblique coursing fracture involving the proximal phalanx of the ring finger. Electronically Signed   By: Rudie Meyer M.D.   On: 08/21/2021 19:21      Assessment and Plan :   PDMP not reviewed this encounter.  1. Finger pain, right   2. Closed nondisplaced fracture of proximal phalanx of right ring finger, initial encounter     Patient was placed into an ulnar gutter splint for her proximal phalanx fracture.  Recommended she wear it at all times, scheduled Tylenol for pain control.  Use tramadol for breakthrough pain.  Follow-up with hand specialist as soon as possible.  Provided her with information to multiple practices. Counseled patient on potential for adverse effects with medications prescribed/recommended today, ER and return-to-clinic precautions discussed, patient verbalized understanding.    Wallis Bamberg, [place] 08/21/21 1940

## 2021-08-21 NOTE — ED Triage Notes (Addendum)
Tripped over a ledge an hour ago, fell onto right side. Fall to outstretched right hand. Right ring finger swollen. Denies being on blood thinners daily, did take 800 mg ibuprofen before arrival. Denies head injury

## 2021-08-23 ENCOUNTER — Ambulatory Visit (INDEPENDENT_AMBULATORY_CARE_PROVIDER_SITE_OTHER): Payer: Medicare Other | Admitting: Orthopedic Surgery

## 2021-08-23 ENCOUNTER — Other Ambulatory Visit: Payer: Self-pay

## 2021-08-23 ENCOUNTER — Encounter: Payer: Self-pay | Admitting: Orthopedic Surgery

## 2021-08-23 DIAGNOSIS — S62614A Displaced fracture of proximal phalanx of right ring finger, initial encounter for closed fracture: Secondary | ICD-10-CM | POA: Diagnosis not present

## 2021-08-23 NOTE — H&P (View-Only) (Signed)
Office Visit Note   [name] [facility]           Date of Birth: November 25, 1941           MRN: [id number] Visit Date: 08/23/2021              Requested by: Maurice Small, MD [address] [place],  [place] [address] PCP: Maurice Small, MD   Assessment & Plan: Visit Diagnoses:  1. Closed displaced fracture of proximal phalanx of right ring finger, initial encounter     Plan: We discussed the diagnosis, prognosis, and both conservative and operative treatment options for her displaced, malrotated right ring finger proximal phalanx fracture.  After our discussion, the patient has elected to proceed with closed versus open reduction and percutaneous pinning.  We reviewed the benefits of surgery and the potential risks including, but not limited to, persistent symptoms, infection, damage to nearby nerves and blood vessels, delayed wound healing.    All patient concerns and questions were addressed.  A surgical date will be confirmed with the patient.    Follow-Up Instructions: No follow-ups on file.   Orders:  No orders of the defined types were placed in this encounter.  No orders of the defined types were placed in this encounter.     Procedures: No procedures performed   Clinical Data: No additional findings.   Subjective: No chief complaint on file.   This is a 80 yo RHD F who presents w/ a closed fracture of the right ring finger proximal phalanx after a GLF on 08/21/21.  She described pain and swelling involving the entire ring finger.  She has significant volar ecchymosis.  She denies pain elsewhere in her hand and denies numbness or paresthesias.    Review of Systems  Constitutional: Negative.   Respiratory: Negative.    Cardiovascular: Negative.   Skin: Negative.     Objective: Vital Signs: BP 134/83 (BP Location: Left Arm, Patient Position: Sitting, Cuff Size: Normal)   Pulse 70   Ht 5' (1.524 m)   Wt 150 lb 9.6 oz (68.3 kg)    SpO2 95%   BMI 29.41 kg/m   Physical Exam Cardiovascular:     Rate and Rhythm: Normal rate.     Pulses: Normal pulses.  Pulmonary:     Effort: Pulmonary effort is normal.  Skin:    General: Skin is warm and dry.     Capillary Refill: Capillary refill takes less than 2 seconds.     Findings: Bruising present.  Neurological:     General: No focal deficit present.     Mental Status: She is alert.    Right Hand Exam   Tenderness  Right hand tenderness location: TTP along ring finger centered over proximal phalanx.  Other  Sensation: normal Pulse: present  Comments:  Significant swelling and ecchymosis involving right ring finger.  Malrotation of finger with attempted ROM.      Specialty Comments:  No specialty comments available.  Imaging: 3V of the R hand from the ER demonstrates a displaced, oblique fracture through the ring finger proximal phalanx.   PMFS History: Patient Active Problem List   Diagnosis Date Noted   Closed displaced fracture of proximal phalanx of right ring finger 08/23/2021   Onychomycosis 04/17/2021   History reviewed. No pertinent past medical history.  Family History  Problem Relation Age of Onset   Dementia Mother    Hypertension Father    Breast cancer Neg Hx  Past Surgical History:  Procedure Laterality Date   ABDOMINAL HYSTERECTOMY     BREAST BIOPSY Left    No Scar    Social History   Occupational History   Not on file  Tobacco Use   Smoking status: Never   Smokeless tobacco: Never  Substance and Sexual Activity   Alcohol use: No   Drug use: No   Sexual activity: Not on file

## 2021-08-23 NOTE — Progress Notes (Signed)
Office Visit Note   [name] [facility]           Date of Birth: October 11, 1941           MRN: [id number] Visit Date: 08/23/2021              Requested by: Maurice Small, MD [address] [place],  [place] [address] PCP: Maurice Small, MD   Assessment & Plan: Visit Diagnoses:  1. Closed displaced fracture of proximal phalanx of right ring finger, initial encounter     Plan: We discussed the diagnosis, prognosis, and both conservative and operative treatment options for her displaced, malrotated right ring finger proximal phalanx fracture.  After our discussion, the patient has elected to proceed with closed versus open reduction and percutaneous pinning.  We reviewed the benefits of surgery and the potential risks including, but not limited to, persistent symptoms, infection, damage to nearby nerves and blood vessels, delayed wound healing.    All patient concerns and questions were addressed.  A surgical date will be confirmed with the patient.    Follow-Up Instructions: No follow-ups on file.   Orders:  No orders of the defined types were placed in this encounter.  No orders of the defined types were placed in this encounter.     Procedures: No procedures performed   Clinical Data: No additional findings.   Subjective: No chief complaint on file.   This is a 80 yo RHD F who presents w/ a closed fracture of the right ring finger proximal phalanx after a GLF on 08/21/21.  She described pain and swelling involving the entire ring finger.  She has significant volar ecchymosis.  She denies pain elsewhere in her hand and denies numbness or paresthesias.    Review of Systems  Constitutional: Negative.   Respiratory: Negative.    Cardiovascular: Negative.   Skin: Negative.     Objective: Vital Signs: BP 134/83 (BP Location: Left Arm, Patient Position: Sitting, Cuff Size: Normal)   Pulse 70   Ht 5' (1.524 m)   Wt 150 lb 9.6 oz (68.3 kg)    SpO2 95%   BMI 29.41 kg/m   Physical Exam Cardiovascular:     Rate and Rhythm: Normal rate.     Pulses: Normal pulses.  Pulmonary:     Effort: Pulmonary effort is normal.  Skin:    General: Skin is warm and dry.     Capillary Refill: Capillary refill takes less than 2 seconds.     Findings: Bruising present.  Neurological:     General: No focal deficit present.     Mental Status: She is alert.    Right Hand Exam   Tenderness  Right hand tenderness location: TTP along ring finger centered over proximal phalanx.  Other  Sensation: normal Pulse: present  Comments:  Significant swelling and ecchymosis involving right ring finger.  Malrotation of finger with attempted ROM.      Specialty Comments:  No specialty comments available.  Imaging: 3V of the R hand from the ER demonstrates a displaced, oblique fracture through the ring finger proximal phalanx.   PMFS History: Patient Active Problem List   Diagnosis Date Noted   Closed displaced fracture of proximal phalanx of right ring finger 08/23/2021   Onychomycosis 04/17/2021   History reviewed. No pertinent past medical history.  Family History  Problem Relation Age of Onset   Dementia Mother    Hypertension Father    Breast cancer Neg Hx  Past Surgical History:  Procedure Laterality Date   ABDOMINAL HYSTERECTOMY     BREAST BIOPSY Left    No Scar    Social History   Occupational History   Not on file  Tobacco Use   Smoking status: Never   Smokeless tobacco: Never  Substance and Sexual Activity   Alcohol use: No   Drug use: No   Sexual activity: Not on file

## 2021-08-27 ENCOUNTER — Encounter (HOSPITAL_BASED_OUTPATIENT_CLINIC_OR_DEPARTMENT_OTHER): Payer: Self-pay | Admitting: Orthopedic Surgery

## 2021-08-29 ENCOUNTER — Other Ambulatory Visit: Payer: Self-pay

## 2021-08-29 ENCOUNTER — Ambulatory Visit (HOSPITAL_BASED_OUTPATIENT_CLINIC_OR_DEPARTMENT_OTHER): Payer: Medicare Other | Admitting: Certified Registered"

## 2021-08-29 ENCOUNTER — Ambulatory Visit (HOSPITAL_BASED_OUTPATIENT_CLINIC_OR_DEPARTMENT_OTHER)
Admission: RE | Admit: 2021-08-29 | Discharge: 2021-08-29 | Disposition: A | Discharge status: Home / Self Care | Payer: Medicare Other | Attending: Orthopedic Surgery | Admitting: Orthopedic Surgery

## 2021-08-29 ENCOUNTER — Encounter (HOSPITAL_BASED_OUTPATIENT_CLINIC_OR_DEPARTMENT_OTHER): Payer: Self-pay | Admitting: Orthopedic Surgery

## 2021-08-29 ENCOUNTER — Encounter (HOSPITAL_BASED_OUTPATIENT_CLINIC_OR_DEPARTMENT_OTHER)
Admission: RE | Disposition: A | Discharge status: Home / Self Care | Payer: Self-pay | Source: Home / Self Care | Attending: Orthopedic Surgery

## 2021-08-29 ENCOUNTER — Telehealth: Payer: Self-pay | Admitting: Orthopedic Surgery

## 2021-08-29 DIAGNOSIS — S62614A Displaced fracture of proximal phalanx of right ring finger, initial encounter for closed fracture: Secondary | ICD-10-CM | POA: Diagnosis present

## 2021-08-29 DIAGNOSIS — X58XXXA Exposure to other specified factors, initial encounter: Secondary | ICD-10-CM | POA: Diagnosis not present

## 2021-08-29 HISTORY — PX: CLOSED REDUCTION FINGER WITH PERCUTANEOUS PINNING: SHX5612

## 2021-08-29 SURGERY — CLOSED REDUCTION, FINGER, WITH PERCUTANEOUS PINNING
Anesthesia: Monitor Anesthesia Care | Site: Finger | Laterality: Right

## 2021-08-29 MED ORDER — CEFAZOLIN SODIUM-DEXTROSE 2-4 GM/100ML-% IV SOLN
2.0000 g | INTRAVENOUS | Status: AC
  Administered 2021-08-29: 2 g via INTRAVENOUS

## 2021-08-29 MED ORDER — ONDANSETRON HCL 4 MG/2ML IJ SOLN: 4.0000 mg | Freq: Four times a day (QID) | INTRAMUSCULAR | Status: DC | PRN

## 2021-08-29 MED ORDER — FENTANYL CITRATE (PF) 100 MCG/2ML IJ SOLN
INTRAMUSCULAR | Status: AC
  Filled 2021-08-29: qty 2

## 2021-08-29 MED ORDER — FENTANYL CITRATE (PF) 100 MCG/2ML IJ SOLN
25.0000 ug | INTRAMUSCULAR | Status: DC | PRN
  Administered 2021-08-29 (×3): 50 ug via INTRAVENOUS

## 2021-08-29 MED ORDER — OXYCODONE HCL 5 MG/5ML PO SOLN: 5.0000 mg | Freq: Once | ORAL | Status: AC | PRN

## 2021-08-29 MED ORDER — FENTANYL CITRATE (PF) 100 MCG/2ML IJ SOLN
INTRAMUSCULAR | Status: DC | PRN
  Administered 2021-08-29 (×2): 50 ug via INTRAVENOUS

## 2021-08-29 MED ORDER — PROPOFOL 500 MG/50ML IV EMUL
INTRAVENOUS | Status: AC
  Filled 2021-08-29: qty 50

## 2021-08-29 MED ORDER — LIDOCAINE 2% (20 MG/ML) 5 ML SYRINGE
INTRAMUSCULAR | Status: AC
  Filled 2021-08-29: qty 5

## 2021-08-29 MED ORDER — OXYCODONE HCL 5 MG PO TABS: 5.0000 mg | ORAL_TABLET | Freq: Four times a day (QID) | ORAL | 0 refills | Status: AC | PRN

## 2021-08-29 MED ORDER — BUPIVACAINE HCL (PF) 0.25 % IJ SOLN
INTRAMUSCULAR | Status: DC | PRN
  Administered 2021-08-29: 4 mL

## 2021-08-29 MED ORDER — LACTATED RINGERS IV SOLN: INTRAVENOUS | Status: DC

## 2021-08-29 MED ORDER — PROPOFOL 10 MG/ML IV BOLUS
INTRAVENOUS | Status: AC
  Filled 2021-08-29: qty 20

## 2021-08-29 MED ORDER — OXYCODONE HCL 5 MG PO TABS
5.0000 mg | ORAL_TABLET | Freq: Once | ORAL | Status: AC | PRN
  Administered 2021-08-29: 5 mg via ORAL

## 2021-08-29 MED ORDER — PROPOFOL 500 MG/50ML IV EMUL
INTRAVENOUS | Status: DC | PRN
  Administered 2021-08-29: 125 ug/kg/min via INTRAVENOUS
  Administered 2021-08-29: 100 ug/kg/min via INTRAVENOUS

## 2021-08-29 MED ORDER — LIDOCAINE HCL (PF) 1 % IJ SOLN
INTRAMUSCULAR | Status: DC | PRN
  Administered 2021-08-29: 4 mL

## 2021-08-29 MED ORDER — LIDOCAINE HCL (CARDIAC) PF 100 MG/5ML IV SOSY
PREFILLED_SYRINGE | INTRAVENOUS | Status: DC | PRN
  Administered 2021-08-29 (×2): 40 mg via INTRAVENOUS

## 2021-08-29 MED ORDER — OXYCODONE HCL 5 MG PO TABS
ORAL_TABLET | ORAL | Status: AC
  Filled 2021-08-29: qty 1

## 2021-08-29 MED ORDER — CEFAZOLIN SODIUM-DEXTROSE 2-4 GM/100ML-% IV SOLN
INTRAVENOUS | Status: AC
  Filled 2021-08-29: qty 100

## 2021-08-29 MED ORDER — EPHEDRINE SULFATE 50 MG/ML IJ SOLN
INTRAMUSCULAR | Status: DC | PRN
  Administered 2021-08-29: 10 mg via INTRAVENOUS

## 2021-08-29 MED ORDER — PHENYLEPHRINE HCL (PRESSORS) 10 MG/ML IV SOLN
INTRAVENOUS | Status: DC | PRN
  Administered 2021-08-29: 80 ug via INTRAVENOUS
  Administered 2021-08-29 (×2): 40 ug via INTRAVENOUS

## 2021-08-29 MED ORDER — ONDANSETRON HCL 4 MG/2ML IJ SOLN
INTRAMUSCULAR | Status: AC
  Filled 2021-08-29: qty 2

## 2021-08-29 MED ORDER — EPHEDRINE 5 MG/ML INJ
INTRAVENOUS | Status: AC
  Filled 2021-08-29: qty 5

## 2021-08-29 MED ORDER — LIDOCAINE HCL (PF) 1 % IJ SOLN
INTRAMUSCULAR | Status: AC
  Filled 2021-08-29: qty 30

## 2021-08-29 MED ORDER — PHENYLEPHRINE 40 MCG/ML (10ML) SYRINGE FOR IV PUSH (FOR BLOOD PRESSURE SUPPORT)
PREFILLED_SYRINGE | INTRAVENOUS | Status: AC
  Filled 2021-08-29: qty 10

## 2021-08-29 MED ORDER — ONDANSETRON HCL 4 MG/2ML IJ SOLN
INTRAMUSCULAR | Status: DC | PRN
  Administered 2021-08-29: 4 mg via INTRAVENOUS

## 2021-08-29 SURGICAL SUPPLY — 47 items
APL PRP STRL LF DISP 70% ISPRP (MISCELLANEOUS) ×1
BLADE SURG 15 STRL LF DISP TIS (BLADE) ×1 IMPLANT
BLADE SURG 15 STRL SS (BLADE) ×2
BNDG CMPR 9X4 STRL LF SNTH (GAUZE/BANDAGES/DRESSINGS)
BNDG COHESIVE 1X5 TAN STRL LF (GAUZE/BANDAGES/DRESSINGS) IMPLANT
BNDG ELASTIC 3X5.8 VLCR STR LF (GAUZE/BANDAGES/DRESSINGS) ×2 IMPLANT
BNDG ESMARK 4X9 LF (GAUZE/BANDAGES/DRESSINGS) IMPLANT
BNDG GAUZE ELAST 4 BULKY (GAUZE/BANDAGES/DRESSINGS) ×2 IMPLANT
CAP PIN PROTECTOR ORTHO WHT (CAP) ×2 IMPLANT
CHLORAPREP W/TINT 26 (MISCELLANEOUS) ×2 IMPLANT
CORD BIPOLAR FORCEPS 12FT (ELECTRODE) IMPLANT
COVER BACK TABLE 60X90IN (DRAPES) ×2 IMPLANT
COVER MAYO STAND STRL (DRAPES) ×2 IMPLANT
CUFF TOURN SGL QUICK 18X4 (TOURNIQUET CUFF) IMPLANT
CUFF TOURN SGL QUICK 24 (TOURNIQUET CUFF)
CUFF TRNQT CYL 24X4X16.5-23 (TOURNIQUET CUFF) IMPLANT
DRAPE EXTREMITY T 121X128X90 (DISPOSABLE) ×2 IMPLANT
DRAPE OEC MINIVIEW 54X84 (DRAPES) ×2 IMPLANT
DRAPE SURG 17X23 STRL (DRAPES) ×2 IMPLANT
GAUZE SPONGE 4X4 12PLY STRL (GAUZE/BANDAGES/DRESSINGS) ×2 IMPLANT
GAUZE XEROFORM 1X8 LF (GAUZE/BANDAGES/DRESSINGS) ×2 IMPLANT
GLOVE SURG ENC MOIS LTX SZ7 (GLOVE) ×2 IMPLANT
GLOVE SURG POLYISO LF SZ6 (GLOVE) ×2 IMPLANT
GLOVE SURG UNDER POLY LF SZ6 (GLOVE) ×2 IMPLANT
GLOVE SURG UNDER POLY LF SZ7 (GLOVE) ×8 IMPLANT
GOWN STRL REUS W/ TWL LRG LVL3 (GOWN DISPOSABLE) ×3 IMPLANT
GOWN STRL REUS W/TWL LRG LVL3 (GOWN DISPOSABLE) ×6
GOWN STRL REUS W/TWL XL LVL3 (GOWN DISPOSABLE) ×2 IMPLANT
K-WIRE .035X4 (WIRE) ×2 IMPLANT
K-WIRE .045X4 (WIRE) ×4 IMPLANT
NEEDLE HYPO 25X1 1.5 SAFETY (NEEDLE) ×2 IMPLANT
NS IRRIG 1000ML POUR BTL (IV SOLUTION) ×2 IMPLANT
PACK BASIN DAY SURGERY FS (CUSTOM PROCEDURE TRAY) ×2 IMPLANT
PAD CAST 3X4 CTTN HI CHSV (CAST SUPPLIES) ×1 IMPLANT
PADDING CAST COTTON 3X4 STRL (CAST SUPPLIES) ×2
SLEEVE SCD COMPRESS KNEE MED (STOCKING) IMPLANT
SPLINT PLASTER CAST XFAST 4X15 (CAST SUPPLIES) ×10 IMPLANT
SPLINT PLASTER XTRA FAST SET 4 (CAST SUPPLIES) ×10
SUCTION FRAZIER HANDLE 10FR (MISCELLANEOUS) ×2
SUCTION TUBE FRAZIER 10FR DISP (MISCELLANEOUS) ×1 IMPLANT
SUT ETHILON 4 0 PS 2 18 (SUTURE) IMPLANT
SUT MNCRL AB 3-0 PS2 18 (SUTURE) IMPLANT
SUT VICRYL 4-0 PS2 18IN ABS (SUTURE) IMPLANT
SYR BULB EAR ULCER 3OZ GRN STR (SYRINGE) IMPLANT
SYR CONTROL 10ML LL (SYRINGE) ×2 IMPLANT
TOWEL GREEN STERILE FF (TOWEL DISPOSABLE) ×2 IMPLANT
UNDERPAD 30X36 HEAVY ABSORB (UNDERPADS AND DIAPERS) ×2 IMPLANT

## 2021-08-29 NOTE — Op Note (Signed)
   Date of Surgery: 08/29/2021  INDICATIONS: Renee Rhodes is a 80 y.o.-year-old female with a right ring finger proximal phalanx fracture.  Risks, benefits, and alternatives to surgery were discussed.  The Patient did consent to the procedure after extensive discussion.   PREOPERATIVE DIAGNOSIS: 1.  Closed right ring finger proximal phalanx fracture  POSTOPERATIVE DIAGNOSIS: Same.  PROCEDURE:  Closed reduction and percutaneous pinning of RIGHT ring finger proximal phalanx fracture   SURGEON: Audria Nine, M.D.  ASSIST:   ANESTHESIA:  general  IV FLUIDS AND URINE: See anesthesia.  ESTIMATED BLOOD LOSS: 5 mL.  IMPLANTS: 0.045 k-wire x 1, 0.035 k-wire x 1   DRAINS: None  COMPLICATIONS: see description of procedure.  DESCRIPTION OF PROCEDURE: The patient was met in the preoperative holding area where the surgical site was marked and the consent form was verified.  The patient was then taken to the operating room and transferred to the operating table.  All bony prominences were well padded.  A tourniquet was applied to the right upper extremity.  The operative extremity was prepped and draped in the usual and sterile fashion.  A formal time-out was performed to confirm that this was the correct patient, surgery, side, and site.   A local block was performed using 5cc of 1% plain lidocaine with 5cc of 0.25% plain marcaine.  Following appropriate analgesia, the fracture was reduced with longitudinal traction and a pointed reduction clamp.  With the fracture reduce on both AP and lateral views, I passed the first oblique k-wire in an antegrade fashion.  The wire was appropriately positioned on all views.  I then tried to pass a second 0.045 k-wire, however, her canal was quite small and this second wire malreduced the fracture.  I then took both wires out and re-reduced the fracture.  I then decided to pass a 0.045 k-wire in an antegrade fashion.  This was appropriately positioned on all  views.  I then passed an antegrade 0.035 k-wire.  The fracture was appropriately reduced on both the AP and lateral views.  The wires were then cut, bent, and dressed with xeroform and a pin cap.  She was then placed into a well-padded ulnar gutter splint.  She was then transferred to the postoperative bed.  All counts were correct x 2 at the end of the procedure.    POSTOPERATIVE PLAN: She will be discharged to home from PACU.  She will be given appropriate pain medication at discharge.  I will see her back in the office in two weeks for repeat imaging.   Audria Nine, MD 10:38 AM

## 2021-08-29 NOTE — Telephone Encounter (Signed)
Patient is s/p CLOSED REDUCTION FINGER WITH PERCUTANEOUS PINNING RIGHT RING FINGER preformed on 08/29/2021

## 2021-08-29 NOTE — Transfer of Care (Signed)
Immediate Anesthesia Transfer of Care Note  [name]  Procedure(s) Performed: CLOSED REDUCTION FINGER WITH PERCUTANEOUS PINNING RIGHT RING FINGER (Right: Finger)  Patient Location: PACU  Anesthesia Type:MAC  Level of Consciousness: drowsy  Airway & Oxygen Therapy: Patient Spontanous Breathing and Patient connected to face mask oxygen  Post-op Assessment: Report given to RN and Post -op Vital signs reviewed and stable  Post vital signs: Reviewed and stable  Last Vitals:  Vitals Value Taken Time  BP 153/71 08/29/21 1033  Temp    Pulse 83 08/29/21 1035  Resp 15 08/29/21 1035  SpO2 100 % 08/29/21 1035  Vitals shown include unvalidated device data.  Last Pain:  Vitals:   08/29/21 0755  TempSrc: Oral  PainSc: 1       Patients Stated Pain Goal: 4 (21/19/41 7408)  Complications: No notable events documented.

## 2021-08-29 NOTE — Discharge Instructions (Addendum)
Post Anesthesia Home Care Instructions  Activity: Get plenty of rest for the remainder of the day. A responsible individual must stay with you for 24 hours following the procedure.  For the next 24 hours, DO NOT: -Drive a car -Advertising copywriter -Drink alcoholic beverages -Take any medication unless instructed by your physician -Make any legal decisions or sign important papers.  Meals: Start with liquid foods such as gelatin or soup. Progress to regular foods as tolerated. Avoid greasy, spicy, heavy foods. If nausea and/or vomiting occur, drink only clear liquids until the nausea and/or vomiting subsides. Call your physician if vomiting continues.  Special Instructions/Symptoms: Your throat may feel dry or sore from the anesthesia or the breathing tube placed in your throat during surgery. If this causes discomfort, gargle with warm salt water. The discomfort should disappear within 24 hours.  If you had a scopolamine patch placed behind your ear for the management of post- operative nausea and/or vomiting:  1. The medication in the patch is effective for 72 hours, after which it should be removed.  Wrap patch in a tissue and discard in the trash. Wash hands thoroughly with soap and water. 2. You may remove the patch earlier than 72 hours if you experience unpleasant side effects which may include dry mouth, dizziness or visual disturbances. 3. Avoid touching the patch. Wash your hands with soap and water after contact with the patch.         Waylan Rocher, M.D. Hand Surgery  POST-OPERATIVE DISCHARGE INSTRUCTIONS   PRESCRIPTIONS: You have been given a prescription to be taken as directed for post-operative pain control.  You may also take over the counter ibuprofen/aleve and tylenol for pain. Take this as directed on the packaging. Do not exceed 3000 mg tylenol/acetaminophen in 24 hours.  Ibuprofen 600-800 mg (3-4) tablets by mouth every 6 hours as needed for pain.   OR Aleve 2 tablets by mouth every 12 hours (twice daily) as needed for pain.  AND/OR Tylenol 1000 mg (2 tablets) every 8 hours as needed for pain.  Please use your pain medication carefully, as refills are limited and you may not be provided with one.  As stated above, please use over the counter pain medicine - it will also be helpful with decreasing your swelling.    ANESTHESIA: After your surgery, post-surgical discomfort or pain is likely. This discomfort can last several days to a few weeks. At certain times of the day your discomfort may be more intense.   Did you receive a nerve block?  A nerve block can provide pain relief for one hour to two days after your surgery. As long as the nerve block is working, you will experience little or no sensation in the area the surgeon operated on.  As the nerve block wears off, you will begin to experience pain or discomfort. It is very important that you begin taking your prescribed pain medication before the nerve block fully wears off. Treating your pain at the first sign of the block wearing off will ensure your pain is better controlled and more tolerable when full-sensation returns. Do not wait until the pain is intolerable, as the medicine will be less effective. It is better to treat pain in advance than to try and catch up.   General Anesthesia:  If you did not receive a nerve block during your surgery, you will need to start taking your pain medication shortly after your surgery and should continue to do so as prescribed by  your surgeon.     ICE AND ELEVATION: You may use ice for the first 48-72 hours, but it is not critical.   Motion of your fingers is very important s to decrease the swelling. Please follow the finger range of motion exercises below to assist you in regaining your motion. This should be done at least 10 repetitions 3-4 times a day.  Elevation, as much as possible for the next 48 hours, is critical for decreasing  swelling as well as for pain relief. Elevation means when you are seated or lying down, you hand should be at or above your heart. When walking, the hand needs to be at or above the level of your elbow.  If the bandage gets too tight, it may need to be loosened. Please contact our office and we will instruct you in how to do this.    SURGICAL BANDAGES:  Keep your dressing and/or splint clean and dry at all times.  Do not remove until you are seen again in the office.  If careful, you may place a plastic bag over your bandage and tape the end to shower, but be careful, do not get your bandages wet.     HAND THERAPY:  You may not need any. If you do, we will begin this at your follow up visit in the clinic.    ACTIVITY AND WORK: You are encouraged to move any fingers which are not in the bandage. Attached is an instruction sheet on finger motion. Do this as much as you need to make your fingers move fully and keep the swelling down.  Light use of the fingers is allowed to assist the other hand with daily hygiene and eating, but strong gripping or lifting is often uncomfortable and should be avoided.  You might miss a variable period of time from work and hopefully this issue has been discussed prior to surgery. You may not do any heavy work with your affected hand for about 2 weeks.    [facility] [address] [place],  [place]  [address] [phone]

## 2021-08-29 NOTE — Anesthesia Preprocedure Evaluation (Signed)
Anesthesia Evaluation  Patient identified by MRN, date of birth, ID band Patient awake    Reviewed: Allergy & Precautions, H&P , NPO status , Patient's Chart, lab work & pertinent test results  Airway Mallampati: II   Neck ROM: full    Dental   Pulmonary neg pulmonary ROS,    breath sounds clear to auscultation       Cardiovascular negative cardio ROS   Rhythm:regular Rate:Normal     Neuro/Psych    GI/Hepatic   Endo/Other    Renal/GU      Musculoskeletal   Abdominal   Peds  Hematology   Anesthesia Other Findings   Reproductive/Obstetrics                             Anesthesia Physical Anesthesia Plan  ASA: 2  Anesthesia Plan: MAC   Post-op Pain Management:    Induction: Intravenous  PONV Risk Score and Plan: 2 and Propofol infusion, Treatment may vary due to age or medical condition and Ondansetron  Airway Management Planned: Simple Face Mask  Additional Equipment:   Intra-op Plan:   Post-operative Plan:   Informed Consent: I have reviewed the patients History and Physical, chart, labs and discussed the procedure including the risks, benefits and alternatives for the proposed anesthesia with the patient or authorized representative who has indicated his/her understanding and acceptance.     Dental advisory given  Plan Discussed with: CRNA, Anesthesiologist and Surgeon  Anesthesia Plan Comments:         Anesthesia Quick Evaluation

## 2021-08-29 NOTE — Interval H&P Note (Signed)
History and Physical Interval Note:  08/29/2021 8:51 AM  [facility]  has presented today for surgery, with the diagnosis of Right Ring Finger Proximal Phalanx Fracture.  The various methods of treatment have been discussed with the patient and family. After consideration of risks, benefits and other options for treatment, the patient has consented to  Procedure(s): CLOSED REDUCTION FINGER WITH PERCUTANEOUS PINNING RIGHT RING FINGER (Right) as a surgical intervention.  The patient's history has been reviewed, patient examined, no change in status, stable for surgery.  I have reviewed the patient's chart and labs.  Questions were answered to the patient's satisfaction.     Hatice Bubel Rosslyn Pasion

## 2021-08-29 NOTE — Anesthesia Postprocedure Evaluation (Signed)
Anesthesia Post Note  [name] [facility]  Procedure(s) Performed: CLOSED REDUCTION FINGER WITH PERCUTANEOUS PINNING RIGHT RING FINGER (Right: Finger)     Patient location during evaluation: PACU Anesthesia Type: MAC Level of consciousness: awake and alert Pain management: pain level controlled Vital Signs Assessment: post-procedure vital signs reviewed and stable Respiratory status: spontaneous breathing, nonlabored ventilation, respiratory function stable and patient connected to nasal cannula oxygen Cardiovascular status: stable and blood pressure returned to baseline Postop Assessment: no apparent nausea or vomiting Anesthetic complications: no   No notable events documented.  Last Vitals:  Vitals:   08/29/21 1130 08/29/21 1152  BP: (!) 148/61 (!) 145/63  Pulse: (!) 52 62  Resp: 14 14  Temp:  36.4 C  SpO2: 96% 98%    Last Pain:  Vitals:   08/29/21 1139  TempSrc:   PainSc: [place]

## 2021-08-29 NOTE — Telephone Encounter (Signed)
Patient called advised the pharmacy would not fill the Rx for pain medication until they get prior approval. Patient asked what can she take in the meantime for pain? The number to contact patient is [phone]

## 2021-08-29 NOTE — Brief Op Note (Signed)
08/29/2021  10:36 AM  PATIENT:  Renee Rhodes  80 y.o. female  PRE-OPERATIVE DIAGNOSIS:  Right Ring Finger Proximal Phalanx Fracture  POST-OPERATIVE DIAGNOSIS:  Right Ring Finger Proximal Phalanx Fracture  PROCEDURE:  Procedure(s): CLOSED REDUCTION FINGER WITH PERCUTANEOUS PINNING RIGHT RING FINGER (Right)  SURGEON:  Surgeon(s) and Role:    * Marlyne Beards, MD - Primary  PHYSICIAN ASSISTANT:   ASSISTANTS: none   ANESTHESIA:   local  EBL:  Minimal   BLOOD ADMINISTERED:none  DRAINS: none   LOCAL MEDICATIONS USED:  MARCAINE     SPECIMEN:  No Specimen  DISPOSITION OF SPECIMEN:  N/A  COUNTS:  YES  TOURNIQUET:  * Missing tourniquet times found for documented tourniquets in log: [id number] *  DICTATION: .Dragon Dictation  PLAN OF CARE: Discharge to home after PACU  PATIENT DISPOSITION:  PACU - hemodynamically stable.   Delay start of Pharmacological VTE agent (>24hrs) due to surgical blood loss or risk of bleeding: no

## 2021-08-30 ENCOUNTER — Encounter (HOSPITAL_BASED_OUTPATIENT_CLINIC_OR_DEPARTMENT_OTHER): Payer: Self-pay | Admitting: Orthopedic Surgery

## 2021-09-12 ENCOUNTER — Ambulatory Visit (INDEPENDENT_AMBULATORY_CARE_PROVIDER_SITE_OTHER): Payer: Medicare Other | Admitting: Orthopedic Surgery

## 2021-09-12 ENCOUNTER — Other Ambulatory Visit: Payer: Self-pay

## 2021-09-12 ENCOUNTER — Encounter: Payer: Self-pay | Admitting: Orthopedic Surgery

## 2021-09-12 ENCOUNTER — Ambulatory Visit (INDEPENDENT_AMBULATORY_CARE_PROVIDER_SITE_OTHER): Payer: Medicare Other

## 2021-09-12 ENCOUNTER — Encounter: Payer: Self-pay | Admitting: Occupational Therapy

## 2021-09-12 ENCOUNTER — Ambulatory Visit (INDEPENDENT_AMBULATORY_CARE_PROVIDER_SITE_OTHER): Payer: Medicare Other | Admitting: Occupational Therapy

## 2021-09-12 VITALS — BP 124/80 | HR 65 | Ht 60.0 in | Wt 148.4 lb

## 2021-09-12 DIAGNOSIS — M25641 Stiffness of right hand, not elsewhere classified: Secondary | ICD-10-CM | POA: Diagnosis not present

## 2021-09-12 DIAGNOSIS — R278 Other lack of coordination: Secondary | ICD-10-CM | POA: Diagnosis not present

## 2021-09-12 DIAGNOSIS — R601 Generalized edema: Secondary | ICD-10-CM

## 2021-09-12 DIAGNOSIS — Z9889 Other specified postprocedural states: Secondary | ICD-10-CM

## 2021-09-12 DIAGNOSIS — M25541 Pain in joints of right hand: Secondary | ICD-10-CM | POA: Diagnosis not present

## 2021-09-12 DIAGNOSIS — S62614A Displaced fracture of proximal phalanx of right ring finger, initial encounter for closed fracture: Secondary | ICD-10-CM

## 2021-09-12 DIAGNOSIS — M79641 Pain in right hand: Secondary | ICD-10-CM | POA: Diagnosis not present

## 2021-09-12 DIAGNOSIS — M6281 Muscle weakness (generalized): Secondary | ICD-10-CM

## 2021-09-12 NOTE — Patient Instructions (Addendum)
WEARING SCHEDULE:  Wear splint at ALL times except for hygiene care (May remove splint for exercises and then immediately place back on ONLY if directed by the therapist)  PURPOSE:  To prevent movement and for protection until injury can heal  CARE OF SPLINT:  Keep splint away from heat sources including: stove, radiator or furnace, or a car in sunlight. The splint can melt and will no longer fit you properly  Keep away from pets and children  Clean the splint with rubbing alcohol as needed. * During this time, make sure you also clean your hand/arm as instructed by your therapist and/or perform dressing changes as needed. Then dry hand/arm completely before replacing splint. (When cleaning hand/arm, keep it immobilized in same position until splint is replaced)  PRECAUTIONS/POTENTIAL PROBLEMS: *If you notice or experience increased pain, swelling, numbness, or a lingering reddened area from the splint: Contact your therapist immediately by calling [phone] (option 4).You must wear the splint for protection, but we will get you scheduled for adjustments as quickly as possible.  (If only straps or hooks need to be replaced and NO adjustments to the splint need to be made, just call the office ahead and let them know you are coming in)  If you have any medical concerns or signs of infection, please call your doctor immediately   DIP Flexion (Active Blocked). Keep your back knuckles flexed in the same position that they are in your protective splint (MCP flexion with IP extension):    Hold __ring____ finger firmly at the middle so that only the tip joint can bend. Hold _3-5___ seconds. Repeat __5-10__ times. Do __2-3__ sessions per day. Then put splint right back on.  Copyright  V[employer]. All rights reserved.

## 2021-09-12 NOTE — Progress Notes (Addendum)
   Post-Op Visit Note   [name]           Date of Birth: 1941-08-06           MRN: [id number] Visit Date: 09/12/2021 PCP: Maurice Small, MD   Assessment & Plan:  Chief Complaint:  Chief Complaint  Patient presents with   Right Ring Finger - Routine Post Op   Visit Diagnoses:  1. Post-operative state   2. Closed displaced fracture of proximal phalanx of right ring finger, initial encounter     Plan: Discussed with patient that although there is some translation at the fracture site, the lateral view shows acceptable alignment and there is no clinical malrotation of the finger.  She has predictable stiffness of this finger.  Her pin sites remain clean and dry. The retrograde pin is advanced too far and into the MP joint.  The pin was backed out a bit.  We will keep the pins in for two more weeks at which time we will start therapy to work on ROM of the MP, PIP, and DIP joints.   Follow-Up Instructions: No follow-ups on file.   Orders:  Orders Placed This Encounter  Procedures   XR Finger Ring Right   Ambulatory referral to Occupational Therapy   No orders of the defined types were placed in this encounter.   Imaging: No results found.  PMFS History: Patient Active Problem List   Diagnosis Date Noted   Closed displaced fracture of proximal phalanx of right ring finger 08/23/2021   Onychomycosis 04/17/2021   History reviewed. No pertinent past medical history.  Family History  Problem Relation Age of Onset   Dementia Mother    Hypertension Father    Breast cancer Neg Hx     Past Surgical History:  Procedure Laterality Date   ABDOMINAL HYSTERECTOMY     BREAST BIOPSY Left    No Scar    CLOSED REDUCTION FINGER WITH PERCUTANEOUS PINNING Right 08/29/2021   Procedure: CLOSED REDUCTION FINGER WITH PERCUTANEOUS PINNING RIGHT RING FINGER;  Surgeon: Marlyne Beards, MD;  Location: [facility];  Service: Orthopedics;  Laterality: Right;    Social History   Occupational History   Not on file  Tobacco Use   Smoking status: Never   Smokeless tobacco: Never  Substance and Sexual Activity   Alcohol use: No   Drug use: No   Sexual activity: Not on file

## 2021-09-12 NOTE — Therapy (Signed)
[facility] Physical Therapy [address] [place], [place], [address] Phone: [phone]   Fax:  [phone]  Occupational Therapy Evaluation  Patient Details  Name: Roselind Klus [facility] MRN: [id number] Date of Birth: 12/27/1940 Referring Provider (OT): Dr Frazier Butt   Encounter Date: 09/12/2021   OT End of Session - 09/12/21 1501     Visit Number 1    Number of Visits 13   Eval +12 visits   Date for OT Re-Evaluation 10/24/21   Every 10 visits   Authorization Type [facility] Medicare    Authorization - Visit Number 1   Eval 09/12/21   Progress Note Due on Visit 10    OT Start Time 1045    OT Stop Time 1123    OT Time Calculation (min) 38 min    Activity Tolerance Patient tolerated treatment well;No increased pain    Behavior During Therapy [facility] for tasks assessed/performed             History reviewed. No pertinent past medical history.  Past Surgical History:  Procedure Laterality Date   ABDOMINAL HYSTERECTOMY     BREAST BIOPSY Left    No Scar    CLOSED REDUCTION FINGER WITH PERCUTANEOUS PINNING Right 08/29/2021   Procedure: CLOSED REDUCTION FINGER WITH PERCUTANEOUS PINNING RIGHT RING FINGER;  Surgeon: Marlyne Beards, MD;  Location: [facility];  Service: Orthopedics;  Laterality: Right;    There were no vitals filed for this visit.   Subjective Assessment - 09/12/21 1437     Subjective  Pt reports that she fell sustaining a right RF proximal phalanx fracture. She underwent percutaneous pinning to her R RF on 08/29/21 and she is now 2 weeks post-op. She presents per Dr Frazier Butt for custom ulnar gutter splinting and HEP.    Pertinent History No significant PMH noted per chart review    Patient Stated Goals Decreased pain, get use of hand/finger back    Currently in Pain? Yes    Pain Score 4     Pain Location Hand    Pain Orientation Right    Pain Descriptors / Indicators Discomfort;Aching    Pain Type Surgical pain    Pain Radiating  Towards At pin sites R RF    Pain Onset 1 to 4 weeks ago    Pain Frequency Intermittent    Aggravating Factors  Pin/hand movement    Pain Relieving Factors rest, elevation    Multiple Pain Sites No               [facility] OT Assessment - 09/12/21 0001       Assessment   Medical Diagnosis R RF Proximal phalanx fracture s/p pinning    Referring Provider (OT) Dr Frazier Butt    Onset Date/Surgical Date 08/29/21    Hand Dominance Right    Next MD Visit 2 weeks    Prior Therapy None      Precautions   Precautions Other (comment)   As per fracture healing s/p pinning proximal phalanx   Required Braces or Orthoses Other Brace/Splint   Custom forearm splinting     Balance Screen   Has the patient fallen in the past 6 months Yes    How many times? 1    Has the patient had a decrease in activity level because of a fear of falling?  No    Is the patient reluctant to leave their home because of a fear of falling?  No      Home  Environment  Family/patient expects to be discharged to: Private residence    Living Arrangements --   Family   Lives With Spouse   Family, daughter lives nearby     Prior Function   Level of Independence Independent    Vocation Retired    Leisure Has yorkie/dog   about 23 year old     ADL   ADL comments Pt reports that husband/family assist w/ ADL, opening, lifting and ADL's. Pt is not currently driving      Mobility   Mobility Status Independent      Written Expression   Dominant Hand Right      Cognition   Overall Cognitive Status Within Functional Limits for tasks assessed      Observation/Other Assessments   Observations Pt presents to clinic as walk in per Dr Frazier Butt. She has 2 pins in her right RF following R RF proximal phalanx fx. DOS was 08/29/21, she is now 2 weeks post op. She has min-mod edema noted in her R RF/small noted as compared visually to her left non-injured hand    Skin Integrity Pins x2 prox phalanx R RF      Sensation    Additional Comments NT but pt denies paresthesias at this time      Coordination   Gross Motor Movements are Fluid and Coordinated --   Shoulder, elbow WFL's   Fine Motor Movements are Fluid and Coordinated Not tested   NT secondary to pins in RRF proximal phalanx   Coordination Impaired      Edema   Edema Min-mod edema noted R RF/small      ROM / Strength   AROM / PROM / Strength AROM;Strength   N secondary to fracture/pinning     Hand Function   Comment NT               OT Treatments/Exercises (OP) - 09/12/21 0001       ADLs   ADL Comments Pt was educated in splinting use, care and precautions for protective ulnar gutter splint right RF/SM following pinning of proximal phalanx fracture. Pt was ed in ADL's, keep hand dry at all times, monitor pin sites and no functional use of R dominant hand at this time except for gentle blocked DIP flexion of R RF per Dr Ralene Ok (secondary to spiffness that he noted in her post-op appointment today.   Pt verbalized understanding     Splinting   Splinting Pt was fited with a custom protective ulnar gutter splint Right. Her wrist is in neutral position and RF/SM are flexed at the MP with IP extension. Pt was educated in splinting use, care and precuations. Extra padding and flaring of her splint was performed (during fabrication) at pin sites. Her pin sites are clean and w/o drainage noted. They are covered with xeroform having just come from he post-op visits with Dr Frazier Butt. He had adjusted her distal pin after seeing her today. She was educated in pin site care as well as possible s/s of infection to monitor. She verbalized understainding in hte clinic today.            WEARING SCHEDULE:  Wear splint at ALL times except for hygiene care (May remove splint for exercises and then immediately place back on ONLY if directed by the therapist)  PURPOSE:  To prevent movement and for protection until injury can heal  CARE OF SPLINT:  Keep  splint away from heat sources including: stove, radiator or furnace, or a car  in sunlight. The splint can melt and will no longer fit you properly  Keep away from pets and children  Clean the splint with rubbing alcohol as needed. * During this time, make sure you also clean your hand/arm as instructed by your therapist and/or perform dressing changes as needed. Then dry hand/arm completely before replacing splint. (When cleaning hand/arm, keep it immobilized in same position until splint is replaced)  PRECAUTIONS/POTENTIAL PROBLEMS: *If you notice or experience increased pain, swelling, numbness, or a lingering reddened area from the splint: Contact your therapist immediately by calling [phone] (option 4).You must wear the splint for protection, but we will get you scheduled for adjustments as quickly as possible.  (If only straps or hooks need to be replaced and NO adjustments to the splint need to be made, just call the office ahead and let them know you are coming in)  If you have any medical concerns or signs of infection, please call your doctor immediately   DIP Flexion (Active Blocked). Keep your back knuckles flexed in the same position that they are in your protective splint (MCP flexion with IP extension):    Hold __ring____ finger firmly at the middle so that only the tip joint can bend. Hold _3-5___ seconds. Repeat __5-10__ times. Do __2-3__ sessions per day. Then put splint right back on.  Copyright  [employer]. All rights reserved.    OT Education - 09/12/21 1500     Education Details Splinting use, care and precautions, pin care, edema control protected position of MCP fexion with IP extension and active blocked DIP flexion as directed by MD    Person(s) Educated Patient    Methods Explanation;Demonstration;Handout    Comprehension Verbalized understanding;Need further instruction              OT Short Term Goals - 09/12/21 1510       OT SHORT TERM GOAL #1    Title Pt will be Mod I splinting use, care and precautions, as well as pin care R UE    Time 3    Period Weeks    Status New    Target Date 10/03/21      OT SHORT TERM GOAL #2   Title Pt will be Mod I edema control techniques R hand/digits    Time 3    Period Weeks    Status New    Target Date 10/03/21      OT SHORT TERM GOAL #3   Title Pt will be Mod I initial HEP R RF/small    Time 3    Period Weeks    Status New    Target Date 10/03/21               OT Long Term Goals - 09/12/21 1512       OT LONG TERM GOAL #1   Title Pt wil lbe Mod I upgraded HEP R RF/small    Time 6    Period Weeks    Status New    Target Date 10/24/21      OT LONG TERM GOAL #2   Title Pt will be Mod I scar management pin sits R RF    Time 6    Period Weeks    Status New    Target Date 10/24/21      OT LONG TERM GOAL #3   Title Pt will report mod I ADL's and slef care tasks using R hand as assist/dominant hand    Time 6  Period Weeks    Status New    Target Date 10/24/21      OT LONG TERM GOAL #4   Title Pt will demonstrate increased functional ROM as seen by ability to make a flat finst or better R hand    Time 6    Period Weeks    Status New    Target Date 10/24/21      OT LONG TERM GOAL #5   Title Pt will report decreased pain with light functional use during ADL/activity R hand (ie: brushing teeth, hair, grooming, bathing and dressing) to 3/10 or less    Time 6    Period Weeks    Status New    Target Date 10/24/21              Plan - 09/12/21 1503     Clinical Impression Statement Pt is a 80 y/o RHD female whom presented for custom protective splinting today following R RF proximal phalanx fracture and pinning. DOS is 08/29/21. She is now 2 weeks post op. She denies any significant PMH. She was fitted with a custom ulnar gutter splint R placing her R RF/sm in protected position of MCP flexion with IP extension. She was educated in splininting use, care and precautions  as well as pin site care. She has deficits secondary to edema, stiffness of her hand, decreased functional use, fracture and pain. She should benefit from cont out-pt OT to address splinting needs, scar management, pt/family education and upgradeing HEP for A/ROM as per fracture healing. She will f/u in 1 week for splinting check and adjustments PRN. She will f/u with Dr Frazier Butt 2 weeks from today per her report.    OT Occupational Profile and History Problem Focused Assessment - Including review of records relating to presenting problem    Occupational performance deficits (Please refer to evaluation for details): ADL's;Leisure    Body Structure / Function / Physical Skills ADL;Strength;Dexterity;Pain;Edema;UE functional use;ROM;Scar mobility;Coordination;Flexibility;Wound;Decreased knowledge of precautions;FMC    Rehab Potential Fair    Clinical Decision Making Limited treatment options, no task modification necessary    Comorbidities Affecting Occupational Performance: None    Modification or Assistance to Complete Evaluation  No modification of tasks or assist necessary to complete eval    OT Frequency Other (comment)   1-2x/week for 6 weeks   OT Duration Other (comment)   Eval + 1-2x/week for 6 weeks   OT Treatment/Interventions Self-care/ADL training;Moist Heat;Fluidtherapy;Splinting;Compression bandaging;Therapeutic activities;Therapeutic exercise;Scar mobilization;Cryotherapy;Passive range of motion;Manual Therapy;Patient/family education    Plan Splint check and adjustements, edema control, HEP as per proximal phalanx/fracure healing R RF    Consulted and Agree with Plan of Care Patient             Patient will benefit from skilled therapeutic intervention in order to improve the following deficits and impairments:   Body Structure / Function / Physical Skills: ADL, Strength, Dexterity, Pain, Edema, UE functional use, ROM, Scar mobility, Coordination, Flexibility, Wound, Decreased  knowledge of precautions, FMC       Visit Diagnosis: Pain in joint of right hand - Plan: Ot plan of care cert/re-cert  Pain in right hand - Plan: Ot plan of care cert/re-cert  Stiffness of finger joint of right hand - Plan: Ot plan of care cert/re-cert  Other lack of coordination - Plan: Ot plan of care cert/re-cert  Generalized edema - Plan: Ot plan of care cert/re-cert  Muscle weakness (generalized) - Plan: Ot plan of care cert/re-cert  Problem List Patient Active Problem List   Diagnosis Date Noted   Closed displaced fracture of proximal phalanx of right ring finger 08/23/2021   Onychomycosis 04/17/2021    Alm Bustard, OT/L 09/12/2021, 3:20 PM  [facility] Physical Therapy [address] [place], [place], [address] Phone: [phone]   Fax:  [phone]  Name: Essense Bousquet [facility] MRN: [id number] Date of Birth: Mar 23, 1941

## 2021-09-19 ENCOUNTER — Other Ambulatory Visit: Payer: Self-pay

## 2021-09-19 ENCOUNTER — Ambulatory Visit (INDEPENDENT_AMBULATORY_CARE_PROVIDER_SITE_OTHER): Payer: Medicare Other | Admitting: Occupational Therapy

## 2021-09-19 ENCOUNTER — Encounter: Payer: Self-pay | Admitting: Occupational Therapy

## 2021-09-19 DIAGNOSIS — M79641 Pain in right hand: Secondary | ICD-10-CM | POA: Diagnosis not present

## 2021-09-19 DIAGNOSIS — R278 Other lack of coordination: Secondary | ICD-10-CM

## 2021-09-19 DIAGNOSIS — M25641 Stiffness of right hand, not elsewhere classified: Secondary | ICD-10-CM

## 2021-09-19 DIAGNOSIS — M25541 Pain in joints of right hand: Secondary | ICD-10-CM

## 2021-09-19 DIAGNOSIS — M6281 Muscle weakness (generalized): Secondary | ICD-10-CM

## 2021-09-19 DIAGNOSIS — R601 Generalized edema: Secondary | ICD-10-CM

## 2021-09-19 NOTE — Therapy (Signed)
[facility] Physical Therapy [address] [place], [place], [address] Phone: [phone]   Fax:  [phone]  Occupational Therapy Treatment  Patient Details  Name: Renee Rhodes [facility] MRN: [id number] Date of Birth: 30-Aug-1941 Referring Provider (OT): Dr Frazier Butt   Encounter Date: 09/19/2021   OT End of Session - 09/19/21 1658     Visit Number 2    Number of Visits 13   Eval +12 visits   Date for OT Re-Evaluation 10/24/21   Every 10 visits   Authorization Type [facility] Medicare    Authorization - Visit Number 2   Eval 09/12/21   Progress Note Due on Visit 10    OT Start Time 1020    OT Stop Time 1043    OT Time Calculation (min) 23 min    Activity Tolerance Patient tolerated treatment well;No increased pain    Behavior During Therapy [facility] for tasks assessed/performed             History reviewed. No pertinent past medical history.  Past Surgical History:  Procedure Laterality Date   ABDOMINAL HYSTERECTOMY     BREAST BIOPSY Left    No Scar    CLOSED REDUCTION FINGER WITH PERCUTANEOUS PINNING Right 08/29/2021   Procedure: CLOSED REDUCTION FINGER WITH PERCUTANEOUS PINNING RIGHT RING FINGER;  Surgeon: Marlyne Beards, MD;  Location: [facility];  Service: Orthopedics;  Laterality: Right;    There were no vitals filed for this visit.   Subjective Assessment - 09/19/21 1651     Subjective  Pt reports that splint is fitting well, denies pain in R RF and or pin sites.    Pertinent History No significant PMH noted per chart review    Patient Stated Goals Decreased pain, get use of hand/finger back    Currently in Pain? No/denies    Pain Score 0-No pain    Multiple Pain Sites No                OT Treatments/Exercises (OP) - 09/19/21 0001       ADLs   ADL Comments Verbal review of splinting use, care and precautions for protective ulnar gutter splint right RF/SM following pinning of proximal phalanx fracture. Pt was ed in ADL's,  keep hand dry at all times, monitor pin sites and no functional use of R dominant hand at this time except for gentle blocked DIP flexion of R RF per Dr Ralene Ok (secondary to spiffness that he noted in her post-op appointment today.      Exercises   Exercises Hand      Hand Exercises   Other Hand Exercises Active blocked DIP flexion to R RF. Pt required multimodal cues for positioning and follow through. Stressed active blocked flexion and hold at end range for gentle tendon stretch. It should be noted that her non injured DIP L RF has limited motion as well but not as retricted as her right.      Splinting   Splinting Splint check and adjustments R ulnar gutter splint, replaced straps and velcro pieces as they had come loose and pt husband had taped them on. Minor flare to proximal forearm area. Pt pin sites checked and xeroform reapplied and gauze/stockinette applied as well. Pt was redressed using regular stockinette as she felt as though elastic stockinette was too compressive.                    OT Education - 09/19/21 1658     Education Details Splinting  use, care and precautions, pin care, edema control protected position of MCP fexion with IP extension and active blocked DIP flexion as directed by MD    Person(s) Educated Patient    Methods Explanation;Demonstration;Handout    Comprehension Verbalized understanding;Need further instruction              OT Short Term Goals - 09/12/21 1510       OT SHORT TERM GOAL #1   Title Pt will be Mod I splinting use, care and precautions, as well as pin care R UE    Time 3    Period Weeks    Status New    Target Date 10/03/21      OT SHORT TERM GOAL #2   Title Pt will be Mod I edema control techniques R hand/digits    Time 3    Period Weeks    Status New    Target Date 10/03/21      OT SHORT TERM GOAL #3   Title Pt will be Mod I initial HEP R RF/small    Time 3    Period Weeks    Status New    Target Date 10/03/21                OT Long Term Goals - 09/12/21 1512       OT LONG TERM GOAL #1   Title Pt wil lbe Mod I upgraded HEP R RF/small    Time 6    Period Weeks    Status New    Target Date 10/24/21      OT LONG TERM GOAL #2   Title Pt will be Mod I scar management pin sits R RF    Time 6    Period Weeks    Status New    Target Date 10/24/21      OT LONG TERM GOAL #3   Title Pt will report mod I ADL's and slef care tasks using R hand as assist/dominant hand    Time 6    Period Weeks    Status New    Target Date 10/24/21      OT LONG TERM GOAL #4   Title Pt will demonstrate increased functional ROM as seen by ability to make a flat finst or better R hand    Time 6    Period Weeks    Status New    Target Date 10/24/21      OT LONG TERM GOAL #5   Title Pt will report decreased pain with light functional use during ADL/activity R hand (ie: brushing teeth, hair, grooming, bathing and dressing) to 3/10 or less    Time 6    Period Weeks    Status New    Target Date 10/24/21                   Plan - 09/19/21 1659     Clinical Impression Statement Pt required minor splint adjustements today to replace velcro & straps. Review and performance of HEP required multimodal cues for proper positioning and follow through for active blocked DIP flexion R RF noted.    OT Occupational Profile and History Problem Focused Assessment - Including review of records relating to presenting problem    Occupational performance deficits (Please refer to evaluation for details): ADL's;Leisure    Body Structure / Function / Physical Skills ADL;Strength;Dexterity;Pain;Edema;UE functional use;ROM;Scar mobility;Coordination;Flexibility;Wound;Decreased knowledge of precautions;FMC    Rehab Potential Fair    Clinical Decision Making  Limited treatment options, no task modification necessary    Comorbidities Affecting Occupational Performance: None    Modification or Assistance to Complete  Evaluation  No modification of tasks or assist necessary to complete eval    OT Frequency Other (comment)   1-2x/week for 6 weeks   OT Duration Other (comment)   1-2x/week for 6 weeks   OT Treatment/Interventions Self-care/ADL training;Moist Heat;Fluidtherapy;Splinting;Compression bandaging;Therapeutic activities;Therapeutic exercise;Scar mobilization;Cryotherapy;Passive range of motion;Manual Therapy;Patient/family education    Plan Splint check and adjustements, edema control, HEP as per proximal phalanx/fracure healing R RF    Consulted and Agree with Plan of Care Patient             Patient will benefit from skilled therapeutic intervention in order to improve the following deficits and impairments:   Body Structure / Function / Physical Skills: ADL, Strength, Dexterity, Pain, Edema, UE functional use, ROM, Scar mobility, Coordination, Flexibility, Wound, Decreased knowledge of precautions, [facility]       Visit Diagnosis: Pain in joint of right hand  Pain in right hand  Stiffness of finger joint of right hand  Other lack of coordination  Generalized edema  Muscle weakness (generalized)    Problem List Patient Active Problem List   Diagnosis Date Noted   Closed displaced fracture of proximal phalanx of right ring finger 08/23/2021   Onychomycosis 04/17/2021    Alm Bustard, OT/L 09/19/2021, 5:03 PM  [facility] Physical Therapy [address] [place], [place], [address] Phone: [phone]   Fax:  [phone]  Name: Aslan Montagna [facility] MRN: [id number] Date of Birth: 08-14-1941

## 2021-09-24 ENCOUNTER — Other Ambulatory Visit: Payer: Self-pay

## 2021-09-24 ENCOUNTER — Ambulatory Visit (INDEPENDENT_AMBULATORY_CARE_PROVIDER_SITE_OTHER): Payer: Medicare Other | Admitting: Orthopedic Surgery

## 2021-09-24 ENCOUNTER — Ambulatory Visit (INDEPENDENT_AMBULATORY_CARE_PROVIDER_SITE_OTHER): Payer: Medicare Other

## 2021-09-24 DIAGNOSIS — S62614A Displaced fracture of proximal phalanx of right ring finger, initial encounter for closed fracture: Secondary | ICD-10-CM

## 2021-09-24 NOTE — Progress Notes (Signed)
   Post-Op Visit Note   [name] [facility]           Date of Birth: 02-21-1941           MRN: [id number] Visit Date: 09/24/2021 PCP: Maurice Small, MD   Assessment & Plan:  Chief Complaint:  Chief Complaint  Patient presents with   Right Ring Finger - Routine Post Op   Visit Diagnoses:  1. Closed displaced fracture of proximal phalanx of right ring finger, initial encounter     Plan: She is not approximately 4 weeks out from her surgery.  The k-wires were pulled today.  She is still quite stiff, particularly at the PIP joint w/ moderate PIP swelling.  She has no evidence of malrotation or cross-over of the injured finger.  She will continue hand therapy to work on ROM at the MP, PIP, and DIP joints.  She is coming back to therapy this Thursday.  I can see her back in another month with repeat x-rays and will see how she's doing in terms of ROM.   Follow-Up Instructions: No follow-ups on file.   Orders:  Orders Placed This Encounter  Procedures   XR Finger Ring Right   No orders of the defined types were placed in this encounter.   Imaging: 3 views of the right ring finger taken today are reviewed and interpreted by me.  They demonstrate maintained k-wire position with interval healing at the fracture site.   PMFS History: Patient Active Problem List   Diagnosis Date Noted   Closed displaced fracture of proximal phalanx of right ring finger 08/23/2021   Onychomycosis 04/17/2021   No past medical history on file.  Family History  Problem Relation Age of Onset   Dementia Mother    Hypertension Father    Breast cancer Neg Hx     Past Surgical History:  Procedure Laterality Date   ABDOMINAL HYSTERECTOMY     BREAST BIOPSY Left    No Scar    CLOSED REDUCTION FINGER WITH PERCUTANEOUS PINNING Right 08/29/2021   Procedure: CLOSED REDUCTION FINGER WITH PERCUTANEOUS PINNING RIGHT RING FINGER;  Surgeon: Marlyne Beards, MD;  Location: [facility];   Service: Orthopedics;  Laterality: Right;   Social History   Occupational History   Not on file  Tobacco Use   Smoking status: Never   Smokeless tobacco: Never  Substance and Sexual Activity   Alcohol use: No   Drug use: No   Sexual activity: Not on file

## 2021-09-26 ENCOUNTER — Encounter: Payer: Self-pay | Admitting: Occupational Therapy

## 2021-09-26 ENCOUNTER — Other Ambulatory Visit: Payer: Self-pay

## 2021-09-26 ENCOUNTER — Ambulatory Visit (INDEPENDENT_AMBULATORY_CARE_PROVIDER_SITE_OTHER): Payer: Medicare Other | Admitting: Occupational Therapy

## 2021-09-26 DIAGNOSIS — M25541 Pain in joints of right hand: Secondary | ICD-10-CM | POA: Diagnosis not present

## 2021-09-26 DIAGNOSIS — M25641 Stiffness of right hand, not elsewhere classified: Secondary | ICD-10-CM

## 2021-09-26 DIAGNOSIS — M79641 Pain in right hand: Secondary | ICD-10-CM

## 2021-09-26 DIAGNOSIS — R278 Other lack of coordination: Secondary | ICD-10-CM

## 2021-09-26 DIAGNOSIS — R601 Generalized edema: Secondary | ICD-10-CM

## 2021-09-26 DIAGNOSIS — M6281 Muscle weakness (generalized): Secondary | ICD-10-CM

## 2021-09-26 NOTE — Therapy (Signed)
[facility] Physical Therapy [address] [place], [place], [address] Phone: [phone]   Fax:  [phone]  Occupational Therapy Treatment  Patient Details  Name: Meranda Dechaine [facility] MRN: [id number] Date of Birth: 07-06-1941 Referring Provider (OT): Dr Frazier Butt   Encounter Date: 09/26/2021   OT End of Session - 09/26/21 1402     Visit Number 3    Number of Visits 13   Eval +12 visits   Date for OT Re-Evaluation 10/24/21    Authorization Type [employer] Medicare    Authorization - Visit Number 3   Eval 09/12/21   Progress Note Due on Visit 10    OT Start Time 1301    OT Stop Time 1345    OT Time Calculation (min) 44 min    Activity Tolerance Patient tolerated treatment well;No increased pain    Behavior During Therapy [facility] for tasks assessed/performed             History reviewed. No pertinent past medical history.  Past Surgical History:  Procedure Laterality Date   ABDOMINAL HYSTERECTOMY     BREAST BIOPSY Left    No Scar    CLOSED REDUCTION FINGER WITH PERCUTANEOUS PINNING Right 08/29/2021   Procedure: CLOSED REDUCTION FINGER WITH PERCUTANEOUS PINNING RIGHT RING FINGER;  Surgeon: Marlyne Beards, MD;  Location: [facility];  Service: Orthopedics;  Laterality: Right;    There were no vitals filed for this visit.   Subjective Assessment - 09/26/21 1304     Subjective  Pt reports that she can remove her splint when taking a shower now. Her pins were removed on 09/24/21.                  OT Treatments/Exercises (OP) - 09/26/21 0001       ADLs   ADL Comments Pt reports that her pins were removed on 09/24/21. She also reports that she is able to remove the splint to bathe/take a shower. Splinting use, care and precautions were reviewed with pt today.      Exercises   Exercises Hand      Hand Exercises   Other Hand Exercises Active blocked DIP flexion to R RF. Pt required multimodal cues for positioning and follow through.  Stressed active blocked flexion and hold at end range for gentle tendon stretch.   It should be noted that her non injured DIP L RF has limited motion as well but not as retricted as her right.     Splinting   Splinting Splint check and adjustments right ulnar gutter splint. Splint was re-heated and re-fit allowing for increased MCP flexion of her R RF/small. Splint was then trimmed to allow for DIP to be free secondary to stiffness of joint. Verbal review and performance of active blocked DIP flex ex's R RF. Splint adjustments should allow for better positioning and allow for increased active ROM of DIP R RF.                OT Education - 09/26/21 1402     Education Details Splinting use, care and precautions, edema control protected position of MCP fexion with IP extension and active blocked DIP flexion as directed by MD    Person(s) Educated Patient    Methods Explanation;Demonstration    Comprehension Verbalized understanding;Need further instruction              OT Short Term Goals - 09/26/21 1407       OT SHORT TERM GOAL #1  Title Pt will be Mod I splinting use, care and precautions, as well as pin care R UE    Time 3    Period Weeks    Status Achieved    Target Date 10/03/21      OT SHORT TERM GOAL #2   Title Pt will be Mod I edema control techniques R hand/digits    Time 3    Period Weeks    Status On-going    Target Date 10/03/21      OT SHORT TERM GOAL #3   Title Pt will be Mod I initial HEP R RF/small    Time 3    Period Weeks    Status On-going    Target Date 10/03/21               OT Long Term Goals - 09/12/21 1512       OT LONG TERM GOAL #1   Title Pt wil lbe Mod I upgraded HEP R RF/small    Time 6    Period Weeks    Status New    Target Date 10/24/21      OT LONG TERM GOAL #2   Title Pt will be Mod I scar management pin sits R RF    Time 6    Period Weeks    Status New    Target Date 10/24/21      OT LONG TERM GOAL #3   Title  Pt will report mod I ADL's and slef care tasks using R hand as assist/dominant hand    Time 6    Period Weeks    Status New    Target Date 10/24/21      OT LONG TERM GOAL #4   Title Pt will demonstrate increased functional ROM as seen by ability to make a flat finst or better R hand    Time 6    Period Weeks    Status New    Target Date 10/24/21      OT LONG TERM GOAL #5   Title Pt will report decreased pain with light functional use during ADL/activity R hand (ie: brushing teeth, hair, grooming, bathing and dressing) to 3/10 or less    Time 6    Period Weeks    Status New    Target Date 10/24/21                   Plan - 09/26/21 1403     Clinical Impression Statement Right splint adjustments secondary to pin removal on 09/24/21. MCP's R RF/small were flexed and PIP's extended, DIP was trimmed back to allow for active blocked flexion ex's. Pt should benefit from increased performance of blocked DIP flexion ex's as she remains stiff. Per MD, she may remove her splint to bathe/shower. She verbalized understanding.    OT Occupational Profile and History Problem Focused Assessment - Including review of records relating to presenting problem    Occupational performance deficits (Please refer to evaluation for details): ADL's;Leisure    Body Structure / Function / Physical Skills ADL;Strength;Dexterity;Pain;Edema;UE functional use;ROM;Scar mobility;Coordination;Flexibility;Wound;Decreased knowledge of precautions;[facility]    Rehab Potential Fair    Clinical Decision Making Limited treatment options, no task modification necessary    Comorbidities Affecting Occupational Performance: None    Modification or Assistance to Complete Evaluation  No modification of tasks or assist necessary to complete eval    OT Frequency Other (comment)   1-2x/week for 6 weeks   OT Duration Other (comment)  1-2x/week for 6 weeks   OT Treatment/Interventions Self-care/ADL training;Moist  Heat;Fluidtherapy;Splinting;Compression bandaging;Therapeutic activities;Therapeutic exercise;Scar mobilization;Cryotherapy;Passive range of motion;Manual Therapy;Patient/family education    Plan Splint check, scar/pin management, HEP and edema control techniques.    Consulted and Agree with Plan of Care Patient             Patient will benefit from skilled therapeutic intervention in order to improve the following deficits and impairments:   Body Structure / Function / Physical Skills: ADL, Strength, Dexterity, Pain, Edema, UE functional use, ROM, Scar mobility, Coordination, Flexibility, Wound, Decreased knowledge of precautions, [facility]       Visit Diagnosis: Pain in joint of right hand  Pain in right hand  Stiffness of finger joint of right hand  Other lack of coordination  Generalized edema  Muscle weakness (generalized)    Problem List Patient Active Problem List   Diagnosis Date Noted   Closed displaced fracture of proximal phalanx of right ring finger 08/23/2021   Onychomycosis 04/17/2021    Alm Bustard, OT/L 09/26/2021, 2:09 PM  [facility] Physical Therapy [address] [place], [place], [address] Phone: [phone]   Fax:  [phone]  Name: Annikah Lovins [facility] MRN: [id number] Date of Birth: 05-Nov-1941

## 2021-10-10 ENCOUNTER — Other Ambulatory Visit: Payer: Self-pay

## 2021-10-10 ENCOUNTER — Ambulatory Visit (INDEPENDENT_AMBULATORY_CARE_PROVIDER_SITE_OTHER): Payer: Medicare Other | Admitting: Occupational Therapy

## 2021-10-10 ENCOUNTER — Encounter: Payer: Self-pay | Admitting: Occupational Therapy

## 2021-10-10 DIAGNOSIS — M25541 Pain in joints of right hand: Secondary | ICD-10-CM | POA: Diagnosis not present

## 2021-10-10 DIAGNOSIS — M25641 Stiffness of right hand, not elsewhere classified: Secondary | ICD-10-CM | POA: Diagnosis not present

## 2021-10-10 DIAGNOSIS — R601 Generalized edema: Secondary | ICD-10-CM

## 2021-10-10 DIAGNOSIS — M6281 Muscle weakness (generalized): Secondary | ICD-10-CM

## 2021-10-10 DIAGNOSIS — R278 Other lack of coordination: Secondary | ICD-10-CM

## 2021-10-10 DIAGNOSIS — M79641 Pain in right hand: Secondary | ICD-10-CM

## 2021-10-10 NOTE — Patient Instructions (Signed)
DIP Flexion (Active Blocked)    Hold __ring__ finger firmly at the middle so that only the tip joint can bend. Hold __5-10__ seconds. Repeat _5__ times. Do _2-3__ sessions per day.  PIP Flexion (Active Blocked)    Hold large knuckle straight using other hand. Bend middle joint of __ring____ finger as far as possible. Hold __5__ seconds. Repeat __5__ times. Do _2-3__ sessions per day.   Bend back knuckles and hold with fingertips straight. Hold x5 seconds and repeat 5x. 2-3 times a day.  Wear your splint at all other times

## 2021-10-10 NOTE — Therapy (Signed)
[facility] Physical Therapy [address] [place], [place], [address] Phone: [phone]   Fax:  [phone]  Occupational Therapy Treatment  Patient Details  Name: Takyra Cantrall [facility] MRN: [id number] Date of Birth: 12/21/1940 Referring Provider (OT): Dr Frazier Butt   Encounter Date: 10/10/2021   OT End of Session - 10/10/21 1724     Visit Number 4    Number of Visits 13   Eval + 12 visits   Date for OT Re-Evaluation 10/24/21    Authorization Type [employer] Medicare    Authorization - Visit Number 4    Progress Note Due on Visit 10    OT Start Time 1015    OT Stop Time 1102    OT Time Calculation (min) 47 min    Activity Tolerance Patient tolerated treatment well;No increased pain    Behavior During Therapy [facility] for tasks assessed/performed             History reviewed. No pertinent past medical history.  Past Surgical History:  Procedure Laterality Date   ABDOMINAL HYSTERECTOMY     BREAST BIOPSY Left    No Scar    CLOSED REDUCTION FINGER WITH PERCUTANEOUS PINNING Right 08/29/2021   Procedure: CLOSED REDUCTION FINGER WITH PERCUTANEOUS PINNING RIGHT RING FINGER;  Surgeon: Marlyne Beards, MD;  Location:  [facility];  Service: Orthopedics;  Laterality: Right;    There were no vitals filed for this visit.   Subjective Assessment - 10/10/21 1019     Subjective  Pt reports intermittent or achy hand right.    Pertinent History No significant PMH noted per chart review    Patient Stated Goals Decreased pain, get use of hand/finger back    Currently in Pain? No/denies    Pain Score 0-No pain    Multiple Pain Sites No                 OT Treatments/Exercises (OP) - 10/10/21 0001       ADLs   ADL Comments Pt is currently 6 weeks post-op pins to right RF proximal phalanx today. She is mod I with splinting use, care and precautions and is using her hand for light ADL's. She was instructed in volar finger extension splint use, care and  precuations today and will d/c her forearm based splint at this time.      Exercises   Exercises Hand      Hand Exercises   Other Hand Exercises Began Active blocked MCP, PIP, DIP flexion to R RF and small today. Pt required multimodal cues for positioning and follow through. Stressed active blocked flexion and hold at end range for gentle tendon stretch. Pt education to monitor for PIP extensor lag that increases from where she currently is (~15*).      Splinting   Splinting Pt will discontinue right ulnar gutter splint at this time but save as a back up if needed (If she experiences increased PIP extensor lag >15*). Verbal review and performance of upgraded, active blocked MCP, PIP &  DIP flex ex's R RF and small. Pt was fitted with a new volar R RF extension splint and she was educated in splinting use, care and precautions. This splint places her right RF PIP in extension and her DIP in slight hyperextension. Pt will wear this splint at all times between exercise sessions.                OT Education - 10/10/21 1722     Education Details RF Volar  finger extension splinting use, care and precautions, upgraded HEP for active blocked MCP, PIP and DIP flexion ex's RF R. Pt will wear volar finger extension splint in between ex sessions and reapply older ulnar gutter splint should she see >15* extension lag at her PIP.    Person(s) Educated Patient    Methods Explanation;Demonstration    Comprehension Verbalized understanding;Need further instruction            DIP Flexion (Active Blocked)    Hold __ring__ finger firmly at the middle so that only the tip joint can bend. Hold __5-10__ seconds. Repeat _5__ times. Do _2-3__ sessions per day.  PIP Flexion (Active Blocked)    Hold large knuckle straight using other hand. Bend middle joint of __ring____ finger as far as possible. Hold __5__ seconds. Repeat __5__ times. Do _2-3__ sessions per day.   Bend back knuckles and hold  with fingertips straight. Hold x5 seconds and repeat 5x. 2-3 times a day.  Wear your splint at all other times   OT Short Term Goals - 10/10/21 1729       OT SHORT TERM GOAL #1   Title Pt will be Mod I splinting use, care and precautions, as well as pin care R UE    Time 3    Period Weeks    Status Achieved    Target Date 10/03/21      OT SHORT TERM GOAL #2   Title Pt will be Mod I edema control techniques R hand/digits    Time 3    Period Weeks    Status Achieved    Target Date 10/03/21      OT SHORT TERM GOAL #3   Title Pt will be Mod I initial HEP R RF/small    Time 3    Period Weeks    Status Achieved    Target Date 10/03/21               OT Long Term Goals - 09/12/21 1512       OT LONG TERM GOAL #1   Title Pt wil lbe Mod I upgraded HEP R RF/small    Time 6    Period Weeks    Status New    Target Date 10/24/21      OT LONG TERM GOAL #2   Title Pt will be Mod I scar management pin sits R RF    Time 6    Period Weeks    Status New    Target Date 10/24/21      OT LONG TERM GOAL #3   Title Pt will report mod I ADL's and slef care tasks using R hand as assist/dominant hand    Time 6    Period Weeks    Status New    Target Date 10/24/21      OT LONG TERM GOAL #4   Title Pt will demonstrate increased functional ROM as seen by ability to make a flat finst or better R hand    Time 6    Period Weeks    Status New    Target Date 10/24/21      OT LONG TERM GOAL #5   Title Pt will report decreased pain with light functional use during ADL/activity R hand (ie: brushing teeth, hair, grooming, bathing and dressing) to 3/10 or less    Time 6    Period Weeks    Status New    Target Date 10/24/21  Plan - 10/10/21 1726     Clinical Impression Statement Pt is currently 6 weeks post op today. She will discontinue right ulnar gutter splint at this time but save as a back up if needed (If she experiences increased PIP extensor lag  >15*). Verbal review and performance of upgraded, active blocked MCP, PIP &  DIP flex ex's R RF and small. Pt was fitted with a new volar R RF extension splint and she was educated in splinting use, care and precautions. This splint places her right RF PIP in extension and her DIP in slight hyperextension. Pt will wear this splint at all times between exercise sessions.    OT Occupational Profile and History Problem Focused Assessment - Including review of records relating to presenting problem    Occupational performance deficits (Please refer to evaluation for details): ADL's;Leisure    Body Structure / Function / Physical Skills ADL;Strength;Dexterity;Pain;Edema;UE functional use;ROM;Scar mobility;Coordination;Flexibility;Wound;Decreased knowledge of precautions;[facility]    Rehab Potential Fair    Clinical Decision Making Limited treatment options, no task modification necessary    Comorbidities Affecting Occupational Performance: None    Modification or Assistance to Complete Evaluation  No modification of tasks or assist necessary to complete eval    OT Frequency Other (comment)   1-2x/week for 6 weeks   OT Duration Other (comment)    OT Treatment/Interventions Self-care/ADL training;Moist Heat;Fluidtherapy;Splinting;Compression bandaging;Therapeutic activities;Therapeutic exercise;Scar mobilization;Cryotherapy;Passive range of motion;Manual Therapy;Patient/family education    Plan Splint check & adjustments, HEP review and monitor PIP extension of R RF, edema control techniques.    Consulted and Agree with Plan of Care Patient             Patient will benefit from skilled therapeutic intervention in order to improve the following deficits and impairments:   Body Structure / Function / Physical Skills: ADL, Strength, Dexterity, Pain, Edema, UE functional use, ROM, Scar mobility, Coordination, Flexibility, Wound, Decreased knowledge of precautions, [facility]       Visit Diagnosis: Pain in joint  of right hand  Pain in right hand  Stiffness of finger joint of right hand  Other lack of coordination  Generalized edema  Muscle weakness (generalized)    Problem List Patient Active Problem List   Diagnosis Date Noted   Closed displaced fracture of proximal phalanx of right ring finger 08/23/2021   Onychomycosis 04/17/2021    Alm Bustard, OT/L 10/10/2021, 5:32 PM  [facility] [address] [place], [place], [address] Phone: [phone]   Fax:  [phone]  Name: Shakeira Rhee [facility] MRN: [id number] Date of Birth: 03-12-1941

## 2021-10-15 ENCOUNTER — Ambulatory Visit (INDEPENDENT_AMBULATORY_CARE_PROVIDER_SITE_OTHER): Payer: Medicare Other | Admitting: Occupational Therapy

## 2021-10-15 ENCOUNTER — Other Ambulatory Visit: Payer: Self-pay

## 2021-10-15 ENCOUNTER — Encounter: Payer: Self-pay | Admitting: Occupational Therapy

## 2021-10-15 DIAGNOSIS — M6281 Muscle weakness (generalized): Secondary | ICD-10-CM

## 2021-10-15 DIAGNOSIS — M25641 Stiffness of right hand, not elsewhere classified: Secondary | ICD-10-CM

## 2021-10-15 DIAGNOSIS — M25541 Pain in joints of right hand: Secondary | ICD-10-CM

## 2021-10-15 DIAGNOSIS — R601 Generalized edema: Secondary | ICD-10-CM

## 2021-10-15 DIAGNOSIS — R278 Other lack of coordination: Secondary | ICD-10-CM

## 2021-10-15 DIAGNOSIS — M79641 Pain in right hand: Secondary | ICD-10-CM | POA: Diagnosis not present

## 2021-10-15 NOTE — Therapy (Signed)
[facility] Physical Therapy [address] [place], [place], [address] Phone: [phone]   Fax:  [phone]  Occupational Therapy Treatment  Patient Details  Name: Renee Rhodes [facility] MRN: [id number] Date of Birth: 30-Apr-1941 Referring Provider (OT): Dr Frazier Butt   Encounter Date: 10/15/2021   OT End of Session - 10/15/21 1508     Activity Tolerance Patient tolerated treatment well;No increased pain    Behavior During Therapy [facility] for tasks assessed/performed             History reviewed. No pertinent past medical history.  Past Surgical History:  Procedure Laterality Date   ABDOMINAL HYSTERECTOMY     BREAST BIOPSY Left    No Scar    CLOSED REDUCTION FINGER WITH PERCUTANEOUS PINNING Right 08/29/2021   Procedure: CLOSED REDUCTION FINGER WITH PERCUTANEOUS PINNING RIGHT RING FINGER;  Surgeon: Marlyne Beards, MD;  Location: [facility];  Service: Orthopedics;  Laterality: Right;    There were no vitals filed for this visit.   Subjective Assessment - 10/15/21 0809     Subjective  Pt reports that she has aching in her finger R small finger > RF due to stiffness. She states that edema is improving.    Pertinent History No significant PMH noted per chart review    Patient Stated Goals Decreased pain, get use of hand/finger back    Currently in Pain? Yes    Pain Score 2     Pain Location Finger (Comment which one)    Pain Orientation Right    Pain Descriptors / Indicators Sore    Pain Type Acute pain    Pain Onset More than a month ago    Pain Frequency Intermittent    Multiple Pain Sites No               [facility] OT Assessment - 10/15/21 0001       Coordination   Fine Motor Movements are Fluid and Coordinated No      ROM / Strength   AROM / PROM / Strength AROM      Right Hand AROM   R Ring  MCP 0-90 66 Degrees    R Ring PIP 0-100 44 Degrees   -20 extension   R Ring DIP 0-70 7 Degrees    R Little  MCP 0-90 52 Degrees     R Little PIP 0-100 71 Degrees   -30* extension   R Little DIP 0-70 22 Degrees               OT Treatments/Exercises (OP) - 10/15/21 0001       ADLs   ADL Comments Pt is currently 6 weeks and 5 days post op pins to R RF proximal phalanx fracture. She reports that she is mod I using her right hand for light ADL's within finger extension splint on R RF. She was educated in use of hand during eating, bathing, brushing her teeth, doing her hair, holding utensils, hand writing etc. Pt was issued cylindrical foam and educated in use at home as well as use of wide grip handles for visual assist during composite finger flexion activity especially RF and small R hand.   Reviewed large grip utensils online w/ pt today.     Exercises   Exercises Hand      Hand Exercises   Other Hand Exercises Active blocked MCP, PIP, DIP flexion to R RF and small today. Pt requires cues for positioning and follow through. Stressed active blocked flexion  and hold at end range for gentle tendon stretch. Added gentle passive fist using left as assist to encourage increased PIP, DIP flexion right RF/Sm. Reviewed tendon gliding and issued foam roll for visual during hook fist ex's. Pt education to continue to monitor for PIP extensor lag should it increase from where it currently is (~15*).      Splinting   Splinting Pt will remove RF volar extension splint for light functional activity, ADL's and HEP as she is with limited active DIP and PIP flexion R RF. Splint was checked and adjusted as her dog "chewed on it" when she was exercising last week at home. Also added wider strap at PIP for a better fit for PIP extension. Pt will wear splint at noc but will d/c during the day to allow for increased flexibility to RF/Small. Pt will only reapply splint if she notices increased RF PIP extensor lag >15*.                OT Education - 10/15/21 1503     Education Details RF Volar finger extension splinting use at night and  during heavy activity only, upgraded HEP for active blocked MCP, PIP and DIP flexion ex's RF/small R. Tendon gliding and hook and composite fist, place and hold right digits. Light functional activity and ADL's using R hand to encourage increased A/ROM. Pt will only reapply volar extensipn splint should she see >15* extensor lag at her PIP.    Person(s) Educated Patient    Methods Explanation;Demonstration    Comprehension Verbalized understanding;Need further instruction              OT Short Term Goals - 10/10/21 1729       OT SHORT TERM GOAL #1   Title Pt will be Mod I splinting use, care and precautions, as well as pin care R UE    Time 3    Period Weeks    Status Achieved    Target Date 10/03/21      OT SHORT TERM GOAL #2   Title Pt will be Mod I edema control techniques R hand/digits    Time 3    Period Weeks    Status Achieved    Target Date 10/03/21      OT SHORT TERM GOAL #3   Title Pt will be Mod I initial HEP R RF/small    Time 3    Period Weeks    Status Achieved    Target Date 10/03/21               OT Long Term Goals - 09/12/21 1512       OT LONG TERM GOAL #1   Title Pt wil lbe Mod I upgraded HEP R RF/small    Time 6    Period Weeks    Status New    Target Date 10/24/21      OT LONG TERM GOAL #2   Title Pt will be Mod I scar management pin sits R RF    Time 6    Period Weeks    Status New    Target Date 10/24/21      OT LONG TERM GOAL #3   Title Pt will report mod I ADL's and slef care tasks using R hand as assist/dominant hand    Time 6    Period Weeks    Status New    Target Date 10/24/21      OT LONG TERM GOAL #4  Title Pt will demonstrate increased functional ROM as seen by ability to make a flat finst or better R hand    Time 6    Period Weeks    Status New    Target Date 10/24/21      OT LONG TERM GOAL #5   Title Pt will report decreased pain with light functional use during ADL/activity R hand (ie: brushing teeth, hair,  grooming, bathing and dressing) to 3/10 or less    Time 6    Period Weeks    Status New    Target Date 10/24/21                   Plan - 10/15/21 1509     Clinical Impression Statement Pt is currently 6 weeks and 5 days post op pins to R RF proximal phalanx fracture. She reports that she is mod I using her right hand for light ADL's within finger extension splint on R RF. She is with limited active ROM R RF especially at her DIP joint despite performing active blocked DIP flexion for several weeks now. She was educated to use her hand during eating, bathing, brushing her teeth, doing her hair, holding utensils, hand writing, folding laundry and all light functional activity at home. Pt wasalso issued cylindrical foam and educated in use at home for visual assist during composite finger flexion activity especially RF and small R hand. She will wear her volar RF extension splint at noc only at this time unless she notices >15* PIP extensor lag at her RF. She verbalized understanding of this in the clinic today.    OT Occupational Profile and History Problem Focused Assessment - Including review of records relating to presenting problem    Occupational performance deficits (Please refer to evaluation for details): ADL's;Leisure    Body Structure / Function / Physical Skills ADL;Strength;Dexterity;Pain;Edema;UE functional use;ROM;Scar mobility;Coordination;Flexibility;Wound;Decreased knowledge of precautions;FMC    Rehab Potential Fair    Clinical Decision Making Limited treatment options, no task modification necessary    Comorbidities Affecting Occupational Performance: None    Modification or Assistance to Complete Evaluation  No modification of tasks or assist necessary to complete eval    OT Frequency Other (comment)   1-2x/wek for 6 weeks   OT Duration Other (comment)    OT Treatment/Interventions Self-care/ADL training;Moist Heat;Fluidtherapy;Splinting;Compression  bandaging;Therapeutic activities;Therapeutic exercise;Scar mobilization;Cryotherapy;Passive range of motion;Manual Therapy;Patient/family education    Plan HEP review and upgrade as able, functional activity, monitor PIP extensor lag R RF, edema conrol techniques, splint check. ?strengthening with putty if cleared by MD. Begin checking LTG's    Consulted and Agree with Plan of Care Patient             Patient will benefit from skilled therapeutic intervention in order to improve the following deficits and impairments:   Body Structure / Function / Physical Skills: ADL, Strength, Dexterity, Pain, Edema, UE functional use, ROM, Scar mobility, Coordination, Flexibility, Wound, Decreased knowledge of precautions, [facility]       Visit Diagnosis: Pain in joint of right hand  Pain in right hand  Stiffness of finger joint of right hand  Other lack of coordination  Generalized edema  Muscle weakness (generalized)    Problem List Patient Active Problem List   Diagnosis Date Noted   Closed displaced fracture of proximal phalanx of right ring finger 08/23/2021   Onychomycosis 04/17/2021    Suttyn Cryder Beth Dixon, OT/L 10/15/2021, 3:17 PM  [facility] Physical Therapy [phone]  [address] [place], [place], [address] Phone: [phone]   Fax:  [phone]  Name: Renee Rhodes [facility] MRN: [id number] Date of Birth: 04/16/1941

## 2021-10-16 ENCOUNTER — Encounter: Payer: Medicare Other | Admitting: Occupational Therapy

## 2021-10-21 ENCOUNTER — Ambulatory Visit: Payer: Medicare Other | Admitting: Orthopedic Surgery

## 2021-10-22 ENCOUNTER — Ambulatory Visit (INDEPENDENT_AMBULATORY_CARE_PROVIDER_SITE_OTHER): Payer: Medicare Other | Admitting: Orthopedic Surgery

## 2021-10-22 ENCOUNTER — Ambulatory Visit (INDEPENDENT_AMBULATORY_CARE_PROVIDER_SITE_OTHER): Payer: Medicare Other

## 2021-10-22 ENCOUNTER — Other Ambulatory Visit: Payer: Self-pay

## 2021-10-22 DIAGNOSIS — S62614A Displaced fracture of proximal phalanx of right ring finger, initial encounter for closed fracture: Secondary | ICD-10-CM

## 2021-10-22 NOTE — Progress Notes (Signed)
   Post-Op Visit Note   [name] [facility]           Date of Birth: 1941-09-29           MRN: [id number] Visit Date: 10/22/2021 PCP: Maurice Small, MD   Assessment & Plan:  Chief Complaint:  Chief Complaint  Patient presents with   Right Ring Finger - Fracture, Follow-up    Doing better, c/o it not bending to well    Visit Diagnoses:  1. Closed displaced fracture of proximal phalanx of right ring finger, initial encounter     Plan: She is now approximately 7 weeks out from surgery now.  She has no pain in this finger.  X-rays today show significant interval healing of the fracture. She has been in therapy to work on ROM.  Her finger is quite stiff with a 10 degree flexion contracture and AROM to approx 70 degree of flexion at PIPJ.  Very limited ROM at DIPJ.  Should continue therapy to work on ROM.  I can see her back in a month to see how she's progressed.   Follow-Up Instructions: No follow-ups on file.   Orders:  Orders Placed This Encounter  Procedures   XR Finger Ring Right   No orders of the defined types were placed in this encounter.   Imaging: No results found.  PMFS History: Patient Active Problem List   Diagnosis Date Noted   Closed displaced fracture of proximal phalanx of right ring finger 08/23/2021   Onychomycosis 04/17/2021   No past medical history on file.  Family History  Problem Relation Age of Onset   Dementia Mother    Hypertension Father    Breast cancer Neg Hx     Past Surgical History:  Procedure Laterality Date   ABDOMINAL HYSTERECTOMY     BREAST BIOPSY Left    No Scar    CLOSED REDUCTION FINGER WITH PERCUTANEOUS PINNING Right 08/29/2021   Procedure: CLOSED REDUCTION FINGER WITH PERCUTANEOUS PINNING RIGHT RING FINGER;  Surgeon: Marlyne Beards, MD;  Location: [facility];  Service: Orthopedics;  Laterality: Right;   Social History   Occupational History   Not on file  Tobacco Use   Smoking status:  Never   Smokeless tobacco: Never  Substance and Sexual Activity   Alcohol use: No   Drug use: No   Sexual activity: Not on file

## 2021-10-29 ENCOUNTER — Other Ambulatory Visit: Payer: Self-pay

## 2021-10-29 ENCOUNTER — Encounter: Payer: Self-pay | Admitting: Occupational Therapy

## 2021-10-29 ENCOUNTER — Ambulatory Visit (INDEPENDENT_AMBULATORY_CARE_PROVIDER_SITE_OTHER): Payer: Medicare Other | Admitting: Occupational Therapy

## 2021-10-29 DIAGNOSIS — M25641 Stiffness of right hand, not elsewhere classified: Secondary | ICD-10-CM | POA: Diagnosis not present

## 2021-10-29 DIAGNOSIS — R278 Other lack of coordination: Secondary | ICD-10-CM | POA: Diagnosis not present

## 2021-10-29 DIAGNOSIS — M25541 Pain in joints of right hand: Secondary | ICD-10-CM | POA: Diagnosis not present

## 2021-10-29 DIAGNOSIS — M79641 Pain in right hand: Secondary | ICD-10-CM | POA: Diagnosis not present

## 2021-10-29 DIAGNOSIS — R601 Generalized edema: Secondary | ICD-10-CM

## 2021-10-29 DIAGNOSIS — M6281 Muscle weakness (generalized): Secondary | ICD-10-CM

## 2021-10-29 NOTE — Patient Instructions (Addendum)
Grip Strengthening (Resistive Putty)    Squeeze putty using thumb and all fingers. Repeat _10-15__ times. Do _1-2_ sessions per day.  Extension (Assistive Putty)    Roll putty back and forth, being sure to use all fingertips. Repeat _15-20__ times. Do _2-3_ sessions per day.

## 2021-10-29 NOTE — Therapy (Signed)
[facility] Physical Therapy [address] [place], [place], [address] Phone: [phone]   Fax:  [phone]  Occupational Therapy Treatment  Patient Details  Name: Renee Rhodes [facility] MRN: [id number] Date of Birth: 1941-08-10 Referring Provider (OT): Dr Tempie Donning   Encounter Date: 10/29/2021   OT End of Session - 10/29/21 1356     Visit Number 6    Number of Visits 13   Eval +12 additional visits PRN   Date for OT Re-Evaluation 10/24/21    Authorization Type [employer]    Authorization - Visit Number 6    Progress Note Due on Visit 10    OT Start Time 1300    OT Stop Time 1341    OT Time Calculation (min) 41 min    Activity Tolerance Patient tolerated treatment well;No increased pain    Behavior During Therapy [facility] for tasks assessed/performed             History reviewed. No pertinent past medical history.  Past Surgical History:  Procedure Laterality Date   ABDOMINAL HYSTERECTOMY     BREAST BIOPSY Left    No Scar    CLOSED REDUCTION FINGER WITH PERCUTANEOUS PINNING Right 08/29/2021   Procedure: CLOSED REDUCTION FINGER WITH PERCUTANEOUS PINNING RIGHT RING FINGER;  Surgeon: Sherilyn Cooter, MD;  Location: [place];  Service: Orthopedics;  Laterality: Right;    There were no vitals filed for this visit.   Subjective Assessment - 10/29/21 1302     Subjective  Pt reports increased fnuctional use right hand (driving, folding clothes, writing and homemaking), she reports soreness today.    Pertinent History No significant PMH noted per chart review    Patient Stated Goals Decreased pain, get use of hand/finger back    Currently in Pain? Yes    Pain Score 4     Pain Location Finger (Comment which one)    Pain Orientation Right    Pain Descriptors / Indicators Sore    Pain Type Acute pain    Pain Onset More than a month ago    Pain Frequency Intermittent    Multiple Pain Sites No                  OT  Treatments/Exercises (OP) - 10/29/21 0001       ADLs   ADL Comments Pt is using her right hand for all ADL's and light functional activity at this time. She is folding laundry, doing dishes, bathing, dressing, cleaning etc. She was educated to continue using it as well for holding utensils, writing etc. Discussed using wide grip handles for increased surface area on knives, pens/pencils and toothbrush etc.   Reviewed use of cylindrical foam on pen, hairbrush, toothbrush, utensils and for A/AROM and hook fisting etc. with right hand. Re educated in buddy strap use for RF/small and donned in the clinic.     Exercises   Exercises Hand      Hand Exercises   Other Hand Exercises Active blocked MCP, PIP, DIP flexion to R RF and small today. Min cues required for  follow through. Stressed active blocked flexion and hold at end range for gentle tendon stretch. Gentle passive fist using left hand to encourage increased composite PIP, DIP flexion right RF/Sm. Reviewed/performed tendon gliding and  foam roll for visual during hook fist ex's. Pt education to continue to monitor for PIP extensor lag should it increase from where it currently is (~15*). Upgraded HEP to include yellow putty for grip  and finger extension - handout issued after performance in clinic today x20 reps each..      Splinting   Splinting Verbal review of all of the following:  Wearing RF volar extension splint at night only. It is off all other times for ADL's, functional use and HEP as she is with limited active DIP and PIP flexion R RF.  Pt will wear splint at noc but will d/c during the day to allow for increased flexibility to RF/Small. Pt will only reapply splint if she notices increased RF PIP extensor lag >15*.      Manual Therapy   Manual Therapy Soft tissue mobilization;Joint mobilization    Manual therapy comments 10 min gentle joint mobilization to R RF PIP/DIP and P/ROM for composite finger flexion and extension in clinic  today. Reviewed and performed pin site scar massage and soft tissue mobilization at R RF PIP due to stiffness.                 OT Education - 10/29/21 1355     Education Details Upgraded HEP, yellow putty for finger flexion and extension ex's, functional use R hand for all ADL's, use wide grip handles and buddy straps to encourage composite finger flexion R RF/Sm.    Person(s) Educated Patient    Methods Explanation;Demonstration    Comprehension Verbalized understanding;Need further instruction              OT Short Term Goals - 10/10/21 1729       OT SHORT TERM GOAL #1   Title Pt will be Mod I splinting use, care and precautions, as well as pin care R UE    Time 3    Period Weeks    Status Achieved    Target Date 10/03/21      OT SHORT TERM GOAL #2   Title Pt will be Mod I edema control techniques R hand/digits    Time 3    Period Weeks    Status Achieved    Target Date 10/03/21      OT SHORT TERM GOAL #3   Title Pt will be Mod I initial HEP R RF/small    Time 3    Period Weeks    Status Achieved    Target Date 10/03/21               OT Long Term Goals - 10/29/21 1402       OT LONG TERM GOAL #1   Title Pt will be Mod I upgraded HEP R RF/small    Time 6    Period Weeks    Status On-going    Target Date 10/24/21      OT LONG TERM GOAL #2   Title Pt will be Mod I scar management pin sits R RF    Time 6    Period Weeks    Status Achieved    Target Date 10/24/21      OT LONG TERM GOAL #3   Title Pt will report mod I ADL's and self care tasks using R hand as assist/dominant hand    Time 6    Period Weeks    Status Achieved    Target Date 10/24/21      OT LONG TERM GOAL #4   Title Pt will demonstrate increased functional ROM as seen by ability to make a flat finst or better R hand    Time 6    Period Weeks    Status On-going  Target Date 10/24/21      OT LONG TERM GOAL #5   Title Pt will report decreased pain with light functional  use during ADL/activity R hand (ie: brushing teeth, hair, grooming, bathing and dressing) to 3/10 or less    Time 6    Period Weeks    Status Achieved    Target Date 10/24/21                Plan - 10/29/21 1357     Clinical Impression Statement Pt is currently 8 weeks and 5 days post-op pins to R RF proximal phalanx fracture. Pt was reassessed and has met 3/5 LTG's at this time. Her PIP/DIP remain quite stiff despite wearing finger extension splint at noc only. She needed instruction again in use of buddy strap use. She is using her hand for all ADL's and homemaking and should benefit from upgraded HEP as instructed today to include putty for grip and finger extension ex's. Pt will continue to monitor for PIP extensor lag and will notify therapist and return to finger volar extension splint should this occur before her next appointment in ~1 week. Recommend cont hand therapy for an additional 4 weeks.    OT Occupational Profile and History Problem Focused Assessment - Including review of records relating to presenting problem    Occupational performance deficits (Please refer to evaluation for details): ADL's;Leisure    Body Structure / Function / Physical Skills ADL;Strength;Dexterity;Pain;Edema;UE functional use;ROM;Scar mobility;Coordination;Flexibility;Wound;Decreased knowledge of precautions;FMC    Rehab Potential Fair    Clinical Decision Making Limited treatment options, no task modification necessary    Comorbidities Affecting Occupational Performance: None    Modification or Assistance to Complete Evaluation  No modification of tasks or assist necessary to complete eval    OT Frequency Other (comment)   1-2x/week for 6 weeks   OT Duration Other (comment)    OT Treatment/Interventions Self-care/ADL training;Moist Heat;Fluidtherapy;Splinting;Compression bandaging;Therapeutic activities;Therapeutic exercise;Scar mobilization;Cryotherapy;Passive range of motion;Manual  Therapy;Patient/family education    Plan HEP and putty review, upgrade as able, functional activity, monitor PIP extensor lag R RF, Begin checking LTG's    Consulted and Agree with Plan of Care Patient             Patient will benefit from skilled therapeutic intervention in order to improve the following deficits and impairments:   Body Structure / Function / Physical Skills: ADL, Strength, Dexterity, Pain, Edema, UE functional use, ROM, Scar mobility, Coordination, Flexibility, Wound, Decreased knowledge of precautions, [place]       Visit Diagnosis: Pain in joint of right hand - Plan: Ot plan of care cert/re-cert  Pain in right hand - Plan: Ot plan of care cert/re-cert  Stiffness of finger joint of right hand - Plan: Ot plan of care cert/re-cert  Other lack of coordination - Plan: Ot plan of care cert/re-cert  Generalized edema - Plan: Ot plan of care cert/re-cert  Muscle weakness (generalized) - Plan: Ot plan of care cert/re-cert    Problem List Patient Active Problem List   Diagnosis Date Noted   Closed displaced fracture of proximal phalanx of right ring finger 08/23/2021   Onychomycosis 04/17/2021    Almyra Deforest, OT/L 10/29/2021, 2:12 PM  [facility] Physical Therapy [address] [place], [place], [address] Phone: [phone]   Fax:  [phone]  Name: Miles Borkowski [facility] MRN: [id number] Date of Birth: [birth year]

## 2021-11-06 ENCOUNTER — Ambulatory Visit (INDEPENDENT_AMBULATORY_CARE_PROVIDER_SITE_OTHER): Payer: Medicare Other | Admitting: Occupational Therapy

## 2021-11-06 ENCOUNTER — Other Ambulatory Visit: Payer: Self-pay

## 2021-11-06 DIAGNOSIS — M6281 Muscle weakness (generalized): Secondary | ICD-10-CM

## 2021-11-06 DIAGNOSIS — M25641 Stiffness of right hand, not elsewhere classified: Secondary | ICD-10-CM

## 2021-11-06 DIAGNOSIS — M25541 Pain in joints of right hand: Secondary | ICD-10-CM | POA: Diagnosis not present

## 2021-11-06 DIAGNOSIS — R278 Other lack of coordination: Secondary | ICD-10-CM | POA: Diagnosis not present

## 2021-11-06 DIAGNOSIS — R601 Generalized edema: Secondary | ICD-10-CM

## 2021-11-06 DIAGNOSIS — M79641 Pain in right hand: Secondary | ICD-10-CM

## 2021-11-06 NOTE — Therapy (Signed)
[facility] Therapy [address] [place], [place], [address] Phone: [phone]   Fax:  [phone]  Occupational Therapy Treatment  Patient Details  Name: Renee Rhodes [facility] MRN: [id number] Date of Birth: [birth year] Referring Provider (OT): Dr Frazier Butt   Encounter Date: 11/06/2021   OT End of Session - 11/06/21 1702     Visit Number 7    Number of Visits 13   Eval +12 visits   Date for OT Re-Evaluation 10/24/21    Authorization Type [employer] Medicare    Authorization - Visit Number 7    Progress Note Due on Visit 10    OT Start Time 0930    OT Stop Time 1013    OT Time Calculation (min) 43 min    Activity Tolerance Patient tolerated treatment well;No increased pain    Behavior During Therapy [facility] for tasks assessed/performed             No past medical history on file.  Past Surgical History:  Procedure Laterality Date   ABDOMINAL HYSTERECTOMY     BREAST BIOPSY Left    No Scar    CLOSED REDUCTION FINGER WITH PERCUTANEOUS PINNING Right 08/29/2021   Procedure: CLOSED REDUCTION FINGER WITH PERCUTANEOUS PINNING RIGHT RING FINGER;  Surgeon: Marlyne Beards, MD;  Location: [facility];  Service: Orthopedics;  Laterality: Right;    There were no vitals filed for this visit.   Subjective Assessment - 11/06/21 0932     Subjective  Pt denies pain in R RF. Pt reports that she's wearing her finger extension splint at noc.    Pertinent History No significant PMH noted per chart review    Patient Stated Goals Decreased pain, get use of hand/finger back    Currently in Pain? No/denies    Pain Score 0-No pain    Multiple Pain Sites No                [facility] OT Assessment - 11/06/21 0001       Right Hand AROM   R Ring  MCP 0-90 72 Degrees    R Ring PIP 0-100 88 Degrees                OT Treatments/Exercises (OP) - 11/06/21 0001       ADLs   ADL Comments Pt reports that she can pick things up," that finger just  doesn't want to cooperate" with certain activity such as composite grip/flexion. Pt with stiffness at RF and small noted. Verbal review of buddy strap use during the day and for ADL's and light functional activity. Pt reports independence with basic ADL's.      Exercises   Exercises Hand      Hand Exercises   Other Hand Exercises Active blocked MCP, PIP, DIP flexion to R RF and small. Passive composite finger flexion RF/Sm. Discussed tendon gliding with foam roll for visual during hook fist ex's. Also discussed use of ace wrap right hand passive flexion and leave on for 5-13min 1-2x/day. HEP yellow putty for grip and finger extension x20 reps.    Other Hand Exercises Discussed using large sponge or sponge ball at home for grip as pt is having difficulty with yellow putty per her report.      Splinting   Splinting Volar extension splint at noc. She is not wearing it at any other time. She will continue to monitor for extensor lag >15* at her PIPJ of RF      Manual Therapy  Manual Therapy Soft tissue mobilization;Joint mobilization    Manual therapy comments 10 min gentle joint mobilization to R RF PIP/DIP and P/ROM for composite finger flexion and extension in clinic today. Reviewed and performed pin site scar massage and soft tissue mobilization at R RF PIP due to stiffness.                  OT Education - 11/06/21 1701     Education Details HEP, putty for finger flexion and extension ex's, functional use R hand for all ADL's, use wide grip handles and buddy straps to encourage composite finger flexion R RF/Sm.    Person(s) Educated Patient    Methods Explanation;Demonstration    Comprehension Verbalized understanding;Need further instruction              OT Short Term Goals - 10/10/21 1729       OT SHORT TERM GOAL #1   Title Pt will be Mod I splinting use, care and precautions, as well as pin care R UE    Time 3    Period Weeks    Status Achieved    Target Date  10/03/21      OT SHORT TERM GOAL #2   Title Pt will be Mod I edema control techniques R hand/digits    Time 3    Period Weeks    Status Achieved    Target Date 10/03/21      OT SHORT TERM GOAL #3   Title Pt will be Mod I initial HEP R RF/small    Time 3    Period Weeks    Status Achieved    Target Date 10/03/21               OT Long Term Goals - 10/29/21 1402       OT LONG TERM GOAL #1   Title Pt will be Mod I upgraded HEP R RF/small    Time 6    Period Weeks    Status On-going    Target Date 10/24/21      OT LONG TERM GOAL #2   Title Pt will be Mod I scar management pin sits R RF    Time 6    Period Weeks    Status Achieved    Target Date 10/24/21      OT LONG TERM GOAL #3   Title Pt will report mod I ADL's and self care tasks using R hand as assist/dominant hand    Time 6    Period Weeks    Status Achieved    Target Date 10/24/21      OT LONG TERM GOAL #4   Title Pt will demonstrate increased functional ROM as seen by ability to make a flat finst or better R hand    Time 6    Period Weeks    Status On-going    Target Date 10/24/21      OT LONG TERM GOAL #5   Title Pt will report decreased pain with light functional use during ADL/activity R hand (ie: brushing teeth, hair, grooming, bathing and dressing) to 3/10 or less    Time 6    Period Weeks    Status Achieved    Target Date 10/24/21                   Plan - 11/06/21 1703     Clinical Impression Statement Pt is currently 9 weeks and 1 day post op R RF proximal  phalanx fracture. She is now only wearing her finger extension splint at noc. She continues to have difficulty with ADL's secondary to lack of finger flexion R RF. She was with increased finger flexion to her RF/Sm after therapy today with focus on gentle P/ROM and manual therapy. Upgraded HEP for P/ROM at home should assist with increased finger flexion in preparation for increased grip.    OT Occupational Profile and History  Problem Focused Assessment - Including review of records relating to presenting problem    Occupational performance deficits (Please refer to evaluation for details): ADL's;Leisure    Body Structure / Function / Physical Skills ADL;Strength;Dexterity;Pain;Edema;UE functional use;ROM;Scar mobility;Coordination;Flexibility;Wound;Decreased knowledge of precautions;FMC    Rehab Potential Fair    Clinical Decision Making Limited treatment options, no task modification necessary    Comorbidities Affecting Occupational Performance: None    Modification or Assistance to Complete Evaluation  No modification of tasks or assist necessary to complete eval    OT Frequency Other (comment)   1-2x/week 6 weeks   OT Duration Other (comment)    OT Treatment/Interventions Self-care/ADL training;Moist Heat;Fluidtherapy;Splinting;Compression bandaging;Therapeutic activities;Therapeutic exercise;Scar mobilization;Cryotherapy;Passive range of motion;Manual Therapy;Patient/family education    Plan ?Passive composite flexion with ace wrap, HEP upgrade as able, functional activity, monitor for PIP extensor lag R RF, Continue checking LTG's    Consulted and Agree with Plan of Care Patient             Patient will benefit from skilled therapeutic intervention in order to improve the following deficits and impairments:   Body Structure / Function / Physical Skills: ADL, Strength, Dexterity, Pain, Edema, UE functional use, ROM, Scar mobility, Coordination, Flexibility, Wound, Decreased knowledge of precautions, [facility]       Visit Diagnosis: Pain in joint of right hand  Pain in right hand  Stiffness of finger joint of right hand  Other lack of coordination  Generalized edema  Muscle weakness (generalized)    Problem List Patient Active Problem List   Diagnosis Date Noted   Closed displaced fracture of proximal phalanx of right ring finger 08/23/2021   Onychomycosis 04/17/2021    Alm Bustard, OT 11/06/2021, 5:08 PM  [facility] Physical Therapy [address] [place], [place], [address] Phone: [phone]   Fax:  [phone]  Name: Renee Rhodes [facility] MRN: [id number] Date of Birth: 22-Aug-1941

## 2021-11-13 ENCOUNTER — Ambulatory Visit (INDEPENDENT_AMBULATORY_CARE_PROVIDER_SITE_OTHER): Payer: Medicare Other | Admitting: Occupational Therapy

## 2021-11-13 ENCOUNTER — Encounter: Payer: Self-pay | Admitting: Occupational Therapy

## 2021-11-13 ENCOUNTER — Other Ambulatory Visit: Payer: Self-pay

## 2021-11-13 DIAGNOSIS — M25641 Stiffness of right hand, not elsewhere classified: Secondary | ICD-10-CM | POA: Diagnosis not present

## 2021-11-13 DIAGNOSIS — M25541 Pain in joints of right hand: Secondary | ICD-10-CM | POA: Diagnosis not present

## 2021-11-13 DIAGNOSIS — R278 Other lack of coordination: Secondary | ICD-10-CM

## 2021-11-13 DIAGNOSIS — M79641 Pain in right hand: Secondary | ICD-10-CM | POA: Diagnosis not present

## 2021-11-13 DIAGNOSIS — R601 Generalized edema: Secondary | ICD-10-CM

## 2021-11-13 DIAGNOSIS — M6281 Muscle weakness (generalized): Secondary | ICD-10-CM

## 2021-11-13 NOTE — Therapy (Signed)
[facility] Physical Therapy [address] [place], [place], [address] Phone: [phone]   Fax:  [phone]  Occupational Therapy Treatment  Patient Details  Name: Renee Rhodes MRN: [id number] Date of Birth: 04/15/1941 Referring Provider (OT): Dr Frazier Butt   Encounter Date: 11/13/2021   OT End of Session - 11/13/21 1231     Visit Number 8    Number of Visits 13    Date for OT Re-Evaluation 12/11/21    Authorization Type [employer] Medicare    Authorization - Visit Number 8    Progress Note Due on Visit 10    OT Start Time 1016    OT Stop Time 1102    OT Time Calculation (min) 46 min    Activity Tolerance Patient tolerated treatment well    Behavior During Therapy [facility] for tasks assessed/performed             History reviewed. No pertinent past medical history.  Past Surgical History:  Procedure Laterality Date   ABDOMINAL HYSTERECTOMY     BREAST BIOPSY Left    No Scar    CLOSED REDUCTION FINGER WITH PERCUTANEOUS PINNING Right 08/29/2021   Procedure: CLOSED REDUCTION FINGER WITH PERCUTANEOUS PINNING RIGHT RING FINGER;  Surgeon: Marlyne Beards, MD;  Location: [facility];  Service: Orthopedics;  Laterality: Right;    There were no vitals filed for this visit.   Subjective Assessment - 11/13/21 1019     Subjective  Pt reports some pain and numbness in her right hand sometimes. She states that she notices this when doing housework, decorating, baking etc.    Pertinent History No significant PMH noted per chart review    Patient Stated Goals Decreased pain, get use of hand/finger back    Currently in Pain? No/denies    Pain Score 0-No pain    Multiple Pain Sites No                [facility] OT Assessment - 11/13/21 0001       Right Hand AROM   R Ring  MCP 0-90 85 Degrees   Pt is 4cm from [facility]   R Ring PIP 0-100 88 Degrees   -20* active PIP extension and -6* passvie PIP extension   R Ring DIP 0-70 25 Degrees    R Little   MCP 0-90 84 Degrees    R Little PIP 0-100 84 Degrees    R Little DIP 0-70 29 Degrees               OT Treatments/Exercises (OP) - 11/13/21 0001       ADLs   ADL Comments Pt reports some pain and numbness at times with increased homemaking (baking, cleaning, wrapping and decorating) per pt report. She is overall Mod I all ADL's per her report. She cont to report some stiffness in bilateral hands in the morning. Pt was encouraged to cont ADL's and functional use of R hand to encourage increased active finger flexion R RF/SM.   Her numbness does not appear to follow any specific nerve patterns. Will continue to monitor     Exercises   Exercises Hand      Hand Exercises   Other Hand Exercises A/ROM R hand for tendon gliding, composite/flat fist, active wrist flexion/extension, RD/UD in fluidotherapy x26min prior to composite P/ROM right fingers in ace wrap x6 min. Small yellow squeeze ball for finger flexion in hte clinic. Pt was encouraged to look for a spong/stress ball that she may use  for composite gip activity. Examples discussed and researched in clinic.      Modalities   Modalities Fluidotherapy      RUE Fluidotherapy   Number Minutes Fluidotherapy 10 Minutes    RUE Fluidotherapy Location Hand;Wrist    Comments Pt was performing A/ROM for tendon gliding, active wrist flexion/extension, composite, hook, flat fist for duration of this to assist with increasing A/ROM RF      Splinting   Splinting Volar extension splint at noc. She is not wearing it at any other time. She will continue to monitor for extensor lag >15* at her PIPJ of RF      Manual Therapy   Manual Therapy Soft tissue mobilization;Joint mobilization    Manual therapy comments Gentle joint mobilization to R RF PIP/DIP and P/ROM for composite finger flexion and extension in clinic today and with passive ace wrap stretch x6 min.  Soft tissue mobilization at R RF PIP due to stiffness ( total).                OT Education - 11/13/21 1230     Education Details HEP, putty vs stress ball or large sponge for composite finger flexion and extension ex's, functional use R hand for all ADL's no limitations, use wide grip handles and buddy straps to encourage composite finger flexion R RF/Sm. Noc exten splint R RF    Person(s) Educated Patient    Methods Explanation;Demonstration    Comprehension Verbalized understanding;Need further instruction              OT Short Term Goals - 10/10/21 1729       OT SHORT TERM GOAL #1   Title Pt will be Mod I splinting use, care and precautions, as well as pin care R UE    Time 3    Period Weeks    Status Achieved    Target Date 10/03/21      OT SHORT TERM GOAL #2   Title Pt will be Mod I edema control techniques R hand/digits    Time 3    Period Weeks    Status Achieved    Target Date 10/03/21      OT SHORT TERM GOAL #3   Title Pt will be Mod I initial HEP R RF/small    Time 3    Period Weeks    Status Achieved    Target Date 10/03/21               OT Long Term Goals - 11/13/21 1238       OT LONG TERM GOAL #1   Title Pt will be Mod I upgraded HEP R RF/small    Time 6    Period Weeks    Status On-going    Target Date 12/11/21      OT LONG TERM GOAL #2   Title Pt will be Mod I scar management pin sits R RF    Time 6    Period Weeks    Status Achieved    Target Date 10/24/21      OT LONG TERM GOAL #3   Title Pt will report mod I ADL's and self care tasks using R hand as assist/dominant hand    Time 6    Period Weeks    Status Achieved    Target Date 10/24/21      OT LONG TERM GOAL #4   Title Pt will demonstrate increased functional ROM as seen by ability to make a flat  finst or better R hand    Time 6    Period Weeks    Status On-going    Target Date 12/11/21      OT LONG TERM GOAL #5   Title Pt will report decreased pain with light functional use during ADL/activity R hand (ie: brushing teeth, hair, grooming,  bathing and dressing) to 3/10 or less    Time 6    Period Weeks    Status Achieved    Target Date 10/24/21                Plan - 11/13/21 1232     Clinical Impression Statement Pt is currently 10 weeks and 1 day post-op R RF proximal phalanx fracture. She is wearing the finger extension splint at noc only. She reports Mod I ADL's but RF/Sm stiffness can be frustrating at times. She has made slow steady gains in composite finger flexion with ace wrap and gentle P/ROM/manual therapy. Upgreaed HEP should assist with increased finger flexion in preparation for improved ability to grip objects with flat fist. Refer to ROM assessment in chart for details. Continue to focus on LTG's.    OT Occupational Profile and History Problem Focused Assessment - Including review of records relating to presenting problem    Occupational performance deficits (Please refer to evaluation for details): ADL's;Leisure    Body Structure / Function / Physical Skills ADL;Strength;Dexterity;Pain;Edema;UE functional use;ROM;Scar mobility;Coordination;Flexibility;Wound;Decreased knowledge of precautions;FMC    Rehab Potential Fair    Clinical Decision Making Limited treatment options, no task modification necessary    Comorbidities Affecting Occupational Performance: None    Modification or Assistance to Complete Evaluation  No modification of tasks or assist necessary to complete eval    OT Frequency Other (comment)   1-2x/week for 6 weeks   OT Duration Other (comment)    OT Treatment/Interventions Self-care/ADL training;Moist Heat;Fluidtherapy;Splinting;Compression bandaging;Therapeutic activities;Therapeutic exercise;Scar mobilization;Cryotherapy;Passive range of motion;Manual Therapy;Patient/family education    Plan ?Passive composite flexion with ace wrap, HEP upgrade as able, functional activity, monitor for PIP extensor lag R RF, wear noc extensor splint, Continue w/ focus on LTG's    Consulted and Agree with  Plan of Care Patient             Patient will benefit from skilled therapeutic intervention in order to improve the following deficits and impairments:   Body Structure / Function / Physical Skills: ADL, Strength, Dexterity, Pain, Edema, UE functional use, ROM, Scar mobility, Coordination, Flexibility, Wound, Decreased knowledge of precautions, [facility]       Visit Diagnosis: Pain in joint of right hand  Pain in right hand  Stiffness of finger joint of right hand  Other lack of coordination  Generalized edema  Muscle weakness (generalized)    Problem List Patient Active Problem List   Diagnosis Date Noted   Closed displaced fracture of proximal phalanx of right ring finger 08/23/2021   Onychomycosis 04/17/2021    Alm Bustard, OT 11/13/2021, 12:40 PM  [facility] Physical Therapy [address] [place], [place], [address] Phone: [phone]   Fax:  [phone]  Name: Renee Rhodes MRN: [id number] Date of Birth: [birth year]

## 2021-11-21 ENCOUNTER — Ambulatory Visit (INDEPENDENT_AMBULATORY_CARE_PROVIDER_SITE_OTHER): Payer: Medicare Other | Admitting: Occupational Therapy

## 2021-11-21 ENCOUNTER — Ambulatory Visit (INDEPENDENT_AMBULATORY_CARE_PROVIDER_SITE_OTHER): Payer: Medicare Other | Admitting: Orthopedic Surgery

## 2021-11-21 ENCOUNTER — Encounter: Payer: Self-pay | Admitting: Orthopedic Surgery

## 2021-11-21 ENCOUNTER — Other Ambulatory Visit: Payer: Self-pay

## 2021-11-21 ENCOUNTER — Ambulatory Visit: Payer: Self-pay

## 2021-11-21 VITALS — BP 116/76 | HR 59

## 2021-11-21 DIAGNOSIS — R278 Other lack of coordination: Secondary | ICD-10-CM

## 2021-11-21 DIAGNOSIS — M6281 Muscle weakness (generalized): Secondary | ICD-10-CM

## 2021-11-21 DIAGNOSIS — S62614A Displaced fracture of proximal phalanx of right ring finger, initial encounter for closed fracture: Secondary | ICD-10-CM

## 2021-11-21 DIAGNOSIS — M25641 Stiffness of right hand, not elsewhere classified: Secondary | ICD-10-CM

## 2021-11-21 DIAGNOSIS — Z9889 Other specified postprocedural states: Secondary | ICD-10-CM

## 2021-11-21 NOTE — Progress Notes (Signed)
° °  Post-Op Visit Note   [name] [facility]           Date of Birth: 01-Mar-1941           MRN: [id number] Visit Date: 11/21/2021 PCP: Maurice Small, MD   Assessment & Plan:  Chief Complaint:  Chief Complaint  Patient presents with   Right Hand - Follow-up   Visit Diagnoses:  1. Closed displaced fracture of proximal phalanx of right ring finger, initial encounter   2. Post-operative state     Plan: Patient is still stiff but improving with therapy.  She has much better ROM at the PIP since our last visit.  Her PIP ROM is now 20-70 degrees.  She has near full ROM at the MP joint.  She has occasional pain in the finger but this is mild.  She is still able to do all of her usual activities.  She will continue therapy as she is still seeing improvements and will continue her home exercise program.  I can see her back in another month.   Follow-Up Instructions: No follow-ups on file.   Orders:  Orders Placed This Encounter  Procedures   XR Finger Ring Right   No orders of the defined types were placed in this encounter.   Imaging: No results found.  PMFS History: Patient Active Problem List   Diagnosis Date Noted   Closed displaced fracture of proximal phalanx of right ring finger 08/23/2021   Onychomycosis 04/17/2021   History reviewed. No pertinent past medical history.  Family History  Problem Relation Age of Onset   Dementia Mother    Hypertension Father    Breast cancer Neg Hx     Past Surgical History:  Procedure Laterality Date   ABDOMINAL HYSTERECTOMY     BREAST BIOPSY Left    No Scar    CLOSED REDUCTION FINGER WITH PERCUTANEOUS PINNING Right 08/29/2021   Procedure: CLOSED REDUCTION FINGER WITH PERCUTANEOUS PINNING RIGHT RING FINGER;  Surgeon: Marlyne Beards, MD;  Location: [facility];  Service: Orthopedics;  Laterality: Right;   Social History   Occupational History   Not on file  Tobacco Use   Smoking status: Never    Smokeless tobacco: Never  Substance and Sexual Activity   Alcohol use: No   Drug use: No   Sexual activity: Not on file

## 2021-11-21 NOTE — Therapy (Signed)
[facility] Physical Therapy [address] [place], [place], [address] Phone: [phone]   Fax:  [phone]  Occupational Therapy Treatment  Patient Details  Name: Renee [facility] MRN: [id number] Date of Birth: [birth year] Referring Provider (OT): Dr Frazier Butt   Encounter Date: 11/21/2021   OT End of Session - 11/21/21 1215     Visit Number 9    Number of Visits 13    Date for OT Re-Evaluation 12/11/21    Authorization Type [employer] Medicare    Authorization - Visit Number 9    Progress Note Due on Visit 10    OT Start Time [phone]    OT Stop Time 1021    OT Time Calculation (min) 43 min    Activity Tolerance Patient tolerated treatment well    Behavior During Therapy [facility] for tasks assessed/performed             No past medical history on file.  Past Surgical History:  Procedure Laterality Date   ABDOMINAL HYSTERECTOMY     BREAST BIOPSY Left    No Scar    CLOSED REDUCTION FINGER WITH PERCUTANEOUS PINNING Right 08/29/2021   Procedure: CLOSED REDUCTION FINGER WITH PERCUTANEOUS PINNING RIGHT RING FINGER;  Surgeon: Marlyne Beards, MD;  Location: [facility];  Service: Orthopedics;  Laterality: Right;    There were no vitals filed for this visit.   Subjective Assessment - 11/21/21 0939     Subjective  Pt reports some pain and numbness in her right hand sometimes. Noticed it driving today    Pertinent History No significant PMH noted per chart review    Patient Stated Goals Decreased pain, get use of hand/finger back    Currently in Pain? No/denies              Fluidotherapy x 10 min to decrease joint stiffness at beginning of session.   P/ROM to ring and small finger PIP joint in flex/ext.   Pt issued updated HEP - see pt instructions for details. Pt will need review - required mod to max cueing especially for reverse blocking ex                    OT Education - 11/21/21 1017     Education Details  updated HEP for P/ROM, reverse blocking, and finger/foam rolling    Person(s) Educated Patient    Methods Explanation;Demonstration;Verbal cues;Handout    Comprehension Verbalized understanding;Need further instruction;Returned demonstration              OT Short Term Goals - 10/10/21 1729       OT SHORT TERM GOAL #1   Title Pt will be Mod I splinting use, care and precautions, as well as pin care R UE    Time 3    Period Weeks    Status Achieved    Target Date 10/03/21      OT SHORT TERM GOAL #2   Title Pt will be Mod I edema control techniques R hand/digits    Time 3    Period Weeks    Status Achieved    Target Date 10/03/21      OT SHORT TERM GOAL #3   Title Pt will be Mod I initial HEP R RF/small    Time 3    Period Weeks    Status Achieved    Target Date 10/03/21               OT Long Term Goals - 11/13/21  1238       OT LONG TERM GOAL #1   Title Pt will be Mod I upgraded HEP R RF/small    Time 6    Period Weeks    Status On-going    Target Date 12/11/21      OT LONG TERM GOAL #2   Title Pt will be Mod I scar management pin sits R RF    Time 6    Period Weeks    Status Achieved    Target Date 10/24/21      OT LONG TERM GOAL #3   Title Pt will report mod I ADL's and self care tasks using R hand as assist/dominant hand    Time 6    Period Weeks    Status Achieved    Target Date 10/24/21      OT LONG TERM GOAL #4   Title Pt will demonstrate increased functional ROM as seen by ability to make a flat finst or better R hand    Time 6    Period Weeks    Status On-going    Target Date 12/11/21      OT LONG TERM GOAL #5   Title Pt will report decreased pain with light functional use during ADL/activity R hand (ie: brushing teeth, hair, grooming, bathing and dressing) to 3/10 or less    Time 6    Period Weeks    Status Achieved    Target Date 10/24/21                   Plan - 11/21/21 1217     Clinical Impression Statement Pt  progressing with ROM of Rt ring finger. 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    OT Occupational Profile and History Problem Focused Assessment - Including review of records relating to presenting problem    Occupational performance deficits (Please refer to evaluation for details): ADL's;Leisure    Body Structure / Function / Physical Skills ADL;Strength;Dexterity;Pain;Edema;UE functional use;ROM;Scar mobility;Coordination;Flexibility;Wound;Decreased knowledge of precautions;FMC    Rehab Potential Fair    Clinical Decision Making Limited treatment options, no task modification necessary    Comorbidities Affecting Occupational Performance: None    Modification or Assistance to Complete Evaluation  No modification of tasks or assist necessary to complete eval    OT Frequency Other (comment)   1-2x/week for 6 weeks   OT Duration Other (comment)    OT Treatment/Interventions Self-care/ADL training;Moist Heat;Fluidtherapy;Splinting;Compression bandaging;Therapeutic activities;Therapeutic exercise;Scar mobilization;Cryotherapy;Passive range of motion;Manual Therapy;Patient/family education    Plan 10th progress note!, fluidotherapy, review most recent HEP, progress towards LTG's    Consulted and Agree with Plan of Care Patient             Patient will benefit from skilled therapeutic intervention in order to improve the following deficits and impairments:   Body Structure / Function / Physical Skills: ADL, Strength, Dexterity, Pain, Edema, UE functional use, ROM, Scar mobility, Coordination, Flexibility, Wound, Decreased knowledge of precautions, [facility]       Visit Diagnosis: Stiffness of finger joint of right hand  Muscle weakness (generalized)  Other lack of coordination    Problem List Patient Active Problem List   Diagnosis Date Noted   Closed displaced fracture of proximal phalanx of right ring finger 08/23/2021   Onychomycosis 04/17/2021    Kelli Churn,  OTR/L 11/21/2021, 12:20 PM  [facility] Physical Therapy [address] [place], [place], [address] Phone: [phone]   Fax:  [phone]  Name: Manasvi Dickard [facility] MRN: [id number] Date of Birth: [birth year]

## 2021-11-21 NOTE — Patient Instructions (Signed)
1) PIP / DIP Composite Flexion (Passive Stretch)    Use other hand to bend middle and tip joints of __ring and small____ fingers. Keep big knuckle straight. Hold __10__ seconds. Repeat __5__ times. Do ___4_ sessions per day.  2) PIP Extension (Passive    Use thumb of other hand on top of joint and two fingers under- neath on either side to straighten middle joint of __ring, and small____ fingers. Keep big knuckle slightly bent (not like in picture) Hold _10___ seconds. Repeat __5__ times. Do _4_ sessions per day.   3) PIP Extension (Active Controlled With Wrist and MP Flexion)    Keep wrist up (not bent like picture). Keep big knuckles bent at _80___ flexion, straighten end joints of fingers. Emphasis is on straightening middle joint while keeping big knuckles bent.  Repeat _10___ times. Do __4__ sessions per day.  4) Foam rolling exercise - start in duck bill position, go to hook position, roll fingers to palm, then back to hook, then finish in duck bill. Repeat slowly x 5 reps, 4x/day

## 2021-12-04 ENCOUNTER — Encounter: Payer: Medicare Other | Admitting: Occupational Therapy

## 2021-12-11 ENCOUNTER — Encounter: Payer: Medicare Other | Admitting: Occupational Therapy

## 2021-12-12 ENCOUNTER — Other Ambulatory Visit: Payer: Self-pay

## 2021-12-12 ENCOUNTER — Ambulatory Visit (INDEPENDENT_AMBULATORY_CARE_PROVIDER_SITE_OTHER): Payer: Medicare Other | Admitting: Occupational Therapy

## 2021-12-12 ENCOUNTER — Encounter: Payer: Self-pay | Admitting: Occupational Therapy

## 2021-12-12 DIAGNOSIS — R278 Other lack of coordination: Secondary | ICD-10-CM

## 2021-12-12 DIAGNOSIS — R601 Generalized edema: Secondary | ICD-10-CM

## 2021-12-12 DIAGNOSIS — M25641 Stiffness of right hand, not elsewhere classified: Secondary | ICD-10-CM | POA: Diagnosis not present

## 2021-12-12 DIAGNOSIS — M79641 Pain in right hand: Secondary | ICD-10-CM

## 2021-12-12 DIAGNOSIS — M6281 Muscle weakness (generalized): Secondary | ICD-10-CM | POA: Diagnosis not present

## 2021-12-12 DIAGNOSIS — M25541 Pain in joints of right hand: Secondary | ICD-10-CM

## 2021-12-12 NOTE — Therapy (Signed)
[facility] Physical Therapy [address] [place], [place], [address] Phone: [phone]   Fax:  [phone]  Occupational Therapy Treatment & Recertification  Patient Details  Name: Renee Rhodes MRN: [id number] Date of Birth: 10-22-1941 Referring Provider (OT): Dr Tempie Donning   Encounter Date: 12/12/2021   OT End of Session - 12/12/21 1153     Visit Number 10    Number of Visits 13    Date for OT Re-Evaluation 12/11/21    Authorization Type [employer] Medicare    Authorization - Visit Number 10    Progress Note Due on Visit 10    OT Start Time 1150    OT Stop Time 1240    OT Time Calculation (min) 50 min    Activity Tolerance Patient tolerated treatment well    Behavior During Therapy [facility] for tasks assessed/performed             History reviewed. No pertinent past medical history.  Past Surgical History:  Procedure Laterality Date   ABDOMINAL HYSTERECTOMY     BREAST BIOPSY Left    No Scar    CLOSED REDUCTION FINGER WITH PERCUTANEOUS PINNING Right 08/29/2021   Procedure: CLOSED REDUCTION FINGER WITH PERCUTANEOUS PINNING RIGHT RING FINGER;  Surgeon: Sherilyn Cooter, MD;  Location: [facility];  Service: Orthopedics;  Laterality: Right;    There were no vitals filed for this visit.   Subjective Assessment - 12/12/21 1155     Subjective  Pt reports "I do think it's getting better, It's just taking such a long time"    Pertinent History No significant PMH noted per chart review    Patient Stated Goals Decreased pain, get use of hand/finger back    Currently in Pain? No/denies    Pain Score 0-No pain    Multiple Pain Sites No                    OT Short Term Goals - 10/10/21 1729       OT SHORT TERM GOAL #1   Title Pt will be Mod I splinting use, care and precautions, as well as pin care R UE    Time 3    Period Weeks    Status Achieved    Target Date 10/03/21      OT SHORT TERM GOAL #2   Title Pt will be Mod I  edema control techniques R hand/digits    Time 3    Period Weeks    Status Achieved    Target Date 10/03/21      OT SHORT TERM GOAL #3   Title Pt will be Mod I initial HEP R RF/small    Time 3    Period Weeks    Status Achieved    Target Date 10/03/21               OT Long Term Goals - 12/12/21 1253       OT LONG TERM GOAL #1   Title Pt will be Mod I upgraded HEP R RF/small    Time 6    Period Weeks    Status Achieved    Target Date 12/12/21      OT LONG TERM GOAL #2   Title Pt will be Mod I scar management pin sits R RF    Time 6    Period Weeks    Status Achieved    Target Date 10/24/21      OT LONG TERM GOAL #3  Title Pt will report mod I ADL's and self care tasks using R hand as assist/dominant hand    Time 6    Period Weeks    Status Achieved    Target Date 10/24/21      OT LONG TERM GOAL #4   Title Pt will demonstrate increased functional ROM as seen by ability to make a flat fist or better R hand    Time 6    Period Weeks    Status On-going    Target Date 12/26/21      OT LONG TERM GOAL #5   Title Pt will report decreased pain with light functional use during ADL/activity R hand (ie: brushing teeth, hair, grooming, bathing and dressing) to 3/10 or less    Time 6    Period Weeks    Status Achieved    Target Date 10/24/21              12/12/21 1257  OT Assessment and Plan  Clinical Impression Statement Pt progressing with ROM of Rt ring finger. 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 Pt reports Mod I all ADL's and states that she was recently able to use her right "hand when cutting chicken for dinner". Pt was encouraged to cont HEP and using R hand for all functional activity w/o limitations at this time. She states that she prefers to cont HEP for "a couple more weeks and then maybe I can come back?" for 1 more visit prior to d/c. Will therefore do a recertification and focus on single LTG not yet met.  OT Occupational Profile and  History Problem Focused Assessment - Including review of records relating to presenting problem  Occupational performance deficits (Please refer to evaluation for details): ADL's;Leisure  Pt will benefit from skilled therapeutic intervention in order to improve on the following performance deficits Body Structure / Function / Physical Skills  Body Structure / Function / Physical Skills ADL;Strength;Dexterity;Pain;Edema;UE functional use;ROM;Scar mobility;Coordination;Flexibility;Wound;Decreased knowledge of precautions;[facility]  Rehab Potential Fair  Clinical Decision Making Low  Comorbidities Affecting Occupational Performance: None  Modification or Assistance to Complete Evaluation  No modification of tasks or assist necessary to complete eval  OT Frequency Other (comment) (1-2x/week)  OT Duration Other (comment)  OT Treatment/Interventions Self-care/ADL training;Moist Heat;Fluidtherapy;Splinting;Compression bandaging;Therapeutic activities;Therapeutic exercise;Scar mobilization;Cryotherapy;Passive range of motion;Manual Therapy;Patient/family education  Plan Re-assess and upgrade HEP and anticipate d/c to independent home program next 1-2 visits.  Consulted and Agree with Plan of Care Patient    Patient will benefit from skilled therapeutic intervention in order to improve the following deficits and impairments:   Body Structure / Function / Physical Skills: ADL, Strength, Dexterity, Pain, Edema, UE functional use, ROM, Scar mobility, Coordination, Flexibility, Wound, Decreased knowledge of precautions, [facility]     Visit Diagnosis: Stiffness of finger joint of right hand  Muscle weakness (generalized)  Other lack of coordination  Pain in joint of right hand  Pain in right hand  Generalized edema   Problem List Patient Active Problem List   Diagnosis Date Noted   Closed displaced fracture of proximal phalanx of right ring finger 08/23/2021   Onychomycosis 04/17/2021    Almyra Deforest, OT 12/13/2021, 9:23 AM  [facility] Physical Therapy [address] [place], [place], [address] Phone: [phone]   Fax:  [phone]  Name: Renee Rhodes MRN: [id number] Date of Birth: 06-15-1941

## 2021-12-23 ENCOUNTER — Ambulatory Visit: Payer: Medicare Other | Admitting: Orthopedic Surgery

## 2021-12-26 ENCOUNTER — Ambulatory Visit (INDEPENDENT_AMBULATORY_CARE_PROVIDER_SITE_OTHER): Payer: Medicare Other | Admitting: Occupational Therapy

## 2021-12-26 ENCOUNTER — Ambulatory Visit: Payer: Medicare Other | Admitting: Orthopedic Surgery

## 2021-12-26 ENCOUNTER — Other Ambulatory Visit: Payer: Self-pay

## 2021-12-26 DIAGNOSIS — M25541 Pain in joints of right hand: Secondary | ICD-10-CM

## 2021-12-26 DIAGNOSIS — R601 Generalized edema: Secondary | ICD-10-CM

## 2021-12-26 DIAGNOSIS — M25641 Stiffness of right hand, not elsewhere classified: Secondary | ICD-10-CM | POA: Diagnosis not present

## 2021-12-26 DIAGNOSIS — M6281 Muscle weakness (generalized): Secondary | ICD-10-CM

## 2021-12-26 DIAGNOSIS — R278 Other lack of coordination: Secondary | ICD-10-CM | POA: Diagnosis not present

## 2021-12-26 DIAGNOSIS — M79641 Pain in right hand: Secondary | ICD-10-CM

## 2021-12-26 NOTE — Therapy (Signed)
[facility] Physical Therapy [address] [place], [place], [address] Phone: [phone]   Fax:  [phone]  Occupational Therapy Treatment & Discharge Summary  Patient Details  Name: Renee Rhodes MRN: [id number] Date of Birth: Sep 23, 1941 Referring Provider (OT): Dr Tempie Donning   Encounter Date: 12/26/2021   OT End of Session - 12/26/21 1615     Visit Number 11    Number of Visits 13    Authorization Type [employer] Medicare    Progress Note Due on Visit 10    OT Start Time 1023    OT Stop Time 1105    OT Time Calculation (min) 42 min    Activity Tolerance Patient tolerated treatment well    Behavior During Therapy [facility] for tasks assessed/performed            No past medical history on file.  Past Surgical History:  Procedure Laterality Date   ABDOMINAL HYSTERECTOMY     BREAST BIOPSY Left    No Scar    CLOSED REDUCTION FINGER WITH PERCUTANEOUS PINNING Right 08/29/2021   Procedure: CLOSED REDUCTION FINGER WITH PERCUTANEOUS PINNING RIGHT RING FINGER;  Surgeon: Sherilyn Cooter, MD;  Location: [place];  Service: Orthopedics;  Laterality: Right;   There were no vitals filed for this visit.   Subjective Assessment - 12/26/21 1028     Subjective  Pt denies pain states that she is able to "bend it better" after warming it up using a heating pad.    Pertinent History No significant PMH noted per chart review    Patient Stated Goals Decreased pain, get use of hand/finger back    Currently in Pain? No/denies    Pain Score 0-No pain    Multiple Pain Sites No              [facility] OT Assessment - 12/26/21 0001       ROM / Strength   AROM / PROM / Strength AROM;Strength      AROM   Overall AROM  Deficits      Right Hand AROM   R Ring  MCP 0-90 85 Degrees    R Ring PIP 0-100 85 Degrees    R Ring DIP 0-70 40 Degrees   Pt is 3.5 cm from [facility]   R Little  MCP 0-90 83 Degrees    R Little PIP 0-100 90 Degrees    R Little DIP 0-70 35  Degrees      Hand Function   Right Hand Grip (lbs) 26.4              OT Treatments/Exercises (OP) - 12/26/21 0001       ADLs   ADL Comments Pt reports that she is mod I with ADL's and functional use of right dominant hand. She is able to hold a knife and cut food, but her RF does not flex completely into her palm. She notices increased ROM after heating her hand and stretching. She states that she remains unable to make a flat fist which has been her last LTG      Exercises   Exercises Hand      Hand Exercises   Other Hand Exercises A/ROM R hand for tendon gliding, composite/flat fist, active wrist flexion/extension, RD/UD, reverse blocking ex's, verbal review of yellow putty and stress ball use at home for finger flexion and composite fist.    Other Hand Exercises Stressed DIP flexion exercises as terminal DIP flexion remins difficult  Modalities   Modalities Ultrasound      Ultrasound   Ultrasound Location R RF/SM   volar and dorsal   Ultrasound Parameters pulsed [place] @ 0.8 wts/cm2 with 3 Mhz x6 min    Ultrasound Goals Edema;Pain      RUE Fluidotherapy   Number Minutes Fluidotherapy 10 Minutes    RUE Fluidotherapy Location Hand;Wrist    Comments Pt  performed A/ROM for tendon gliding, active wrist flexion/extension, composite, hook, flat fist for duration of this to assist with increasing A/ROM RF.      Manual Therapy   Manual Therapy Passive ROM   10 min   Passive ROM Gentle P/ROM to R RF & small PIP/DIP, composite finger flexion and extension in clinic today. Soft tissue mobilization at R RF PIP due to stiffness (67min total).              OT Short Term Goals - 10/10/21 1729       OT SHORT TERM GOAL #1   Title Pt will be Mod I splinting use, care and precautions, as well as pin care R UE    Time 3    Period Weeks    Status Achieved    Target Date 10/03/21      OT SHORT TERM GOAL #2   Title Pt will be Mod I edema control techniques R hand/digits    Time  3    Period Weeks    Status Achieved    Target Date 10/03/21      OT SHORT TERM GOAL #3   Title Pt will be Mod I initial HEP R RF/small    Time 3    Period Weeks    Status Achieved    Target Date 10/03/21             OT Long Term Goals - 12/26/21 1604       OT LONG TERM GOAL #1   Title Pt will be Mod I upgraded HEP R RF/small    Time 6    Period Weeks    Status Achieved    Target Date 12/12/21      OT LONG TERM GOAL #2   Title Pt will be Mod I scar management pin sits R RF    Time 6    Period Weeks    Status Achieved    Target Date 10/24/21      OT LONG TERM GOAL #3   Title Pt will report mod I ADL's and self care tasks using R hand as assist/dominant hand    Time 6    Period Weeks    Status Achieved    Target Date 10/24/21      OT LONG TERM GOAL #4   Title Pt will demonstrate increased functional ROM as seen by ability to make a flat fist or better R hand    Time 6    Period Weeks    Status On-going    Target Date 12/26/21      OT LONG TERM GOAL #5   Title Pt will report decreased pain with light functional use during ADL/activity R hand (ie: brushing teeth, hair, grooming, bathing and dressing) to 3/10 or less    Time 6    Period Weeks    Status Achieved    Target Date 10/24/21             Plan - 12/26/21 1609     Clinical Impression Statement Pt reports Mod I all ADL's and  states that she has been able to use her right hand when cutting food and performing ADL's She is still unable to make a flat fist and terminal DIP flexion of her RF/SM remains difficult for her, even as P/ROM is WFL's. She continues to improve her grip strength (R = 26.4# vs left = 42.2#).  Pt was encouraged to cont HEP and using R hand for all functional activity w/o limitations at this time and continue with HEP independently at this time. She has met 4/5 LTG's. Will sign off OT at this time & discharge to independent home program.    OT Occupational Profile and History Problem  Focused Assessment - Including review of records relating to presenting problem    Occupational performance deficits (Please refer to evaluation for details): ADL's;Leisure    Body Structure / Function / Physical Skills ADL;Strength;Dexterity;Pain;Edema;UE functional use;ROM;Scar mobility;Coordination;Flexibility;Wound;Decreased knowledge of precautions;FMC    Rehab Potential Fair    Clinical Decision Making Limited treatment options, no task modification necessary    Comorbidities Affecting Occupational Performance: None    Modification or Assistance to Complete Evaluation  No modification of tasks or assist necessary to complete eval    OT Frequency Other (comment)   1-2x/week   OT Duration Other (comment)    OT Treatment/Interventions Self-care/ADL training;Moist Heat;Fluidtherapy;Splinting;Compression bandaging;Therapeutic activities;Therapeutic exercise;Scar mobilization;Cryotherapy;Passive range of motion;Manual Therapy;Patient/family education    Plan D/C OT at this time.    Consulted and Agree with Plan of Care Patient             Patient will benefit from skilled therapeutic intervention in order to improve the following deficits and impairments:   Body Structure / Function / Physical Skills: ADL, Strength, Dexterity, Pain, Edema, UE functional use, ROM, Scar mobility, Coordination, Flexibility, Wound, Decreased knowledge of precautions, [facility]   Visit Diagnosis: Stiffness of finger joint of right hand  Muscle weakness (generalized)  Other lack of coordination  Pain in joint of right hand  Pain in right hand  Generalized edema  Problem List Patient Active Problem List   Diagnosis Date Noted   Closed displaced fracture of proximal phalanx of right ring finger 08/23/2021   Onychomycosis 04/17/2021  OCCUPATIONAL THERAPY DISCHARGE SUMMARY  Visits from Start of Care: 11  Current functional level related to goals / functional outcomes: Pt reports Mod I all ADL's and  states that she has been able to use her right hand when cutting food and performing ADL's She is still unable to make a flat fist and terminal DIP flexion of her RF/SM remains difficult for her, even as P/ROM is WFL's. Pt RF is 3.5 cm from [facility] w/ A/ROM, but able to touch into her palm passively. She continues to improve her grip strength (R = 26.4# vs left = 42.2#).  Pt was encouraged to cont HEP and using R hand for all functional activity w/o limitations at this time and continue with HEP independently at this time. She has met 4/5 LTG's. Will sign off OT at this time & discharge to independent home program.    Remaining deficits: As per above   Education / Equipment: Putty, Pt education, HEP, functional use, ADL's, pulsed [place]   Patient agrees to discharge. Patient goals were partially met. Patient is being discharged due to maximized rehab potential. .  Percell Miller Ardath Sax, OT 12/26/2021, 4:18 PM  [facility] Physical Therapy [address] [place], [place], [address] Phone: [phone]   Fax:  [phone]  Name: Renee Rhodes MRN: [id number]  Date of Birth: 03-30-1941

## 2022-01-02 ENCOUNTER — Ambulatory Visit: Payer: Medicare Other | Admitting: Orthopedic Surgery

## 2022-01-10 ENCOUNTER — Encounter: Payer: Self-pay | Admitting: Orthopedic Surgery

## 2022-01-10 ENCOUNTER — Ambulatory Visit (INDEPENDENT_AMBULATORY_CARE_PROVIDER_SITE_OTHER): Payer: Medicare Other | Admitting: Orthopedic Surgery

## 2022-01-10 ENCOUNTER — Other Ambulatory Visit: Payer: Self-pay

## 2022-01-10 DIAGNOSIS — S62614A Displaced fracture of proximal phalanx of right ring finger, initial encounter for closed fracture: Secondary | ICD-10-CM | POA: Diagnosis not present

## 2022-01-10 NOTE — Progress Notes (Signed)
Office Visit Note   [name] [facility]           Date of Birth: Mar 28, 1941           MRN: [id number] Visit Date: 01/10/2022              Requested by: Maurice Small, MD [address] [place],  [place] [address] PCP: Maurice Small, MD   Assessment & Plan: Visit Diagnoses:  1. Closed displaced fracture of proximal phalanx of right ring finger, initial encounter     Plan: Patient is over 4 months out from closed duction percutaneous pinning of her right ring finger.  She still has some stiffness in this finger and the adjacent small finger.  She has been doing hand therapy and has since been discharged.  She has a home exercise program that she is doing.  She is able to do most of her activities around the house without difficulty.  She wants to continue to monitor her progress and will call the office if she think she would benefit from additional therapy.  Follow-Up Instructions: No follow-ups on file.   Orders:  No orders of the defined types were placed in this encounter.  No orders of the defined types were placed in this encounter.     Procedures: No procedures performed   Clinical Data: No additional findings.   Subjective: Chief Complaint  Patient presents with   Right Ring Finger - Pain    This is a 81 year old female status post right ring finger closed duction percutaneous pinning over 4 months ago who presents for follow-up.  Her biggest complaint this point is stiffness in the right ring finger and small finger.  She is been doing extensive hand therapy to work on her range of motion.  Her main motion is better but still has some stiffness.  She is able to do most of her activities around the house without any difficulty.  She has no pain in this finger.   Review of Systems   Objective: Vital Signs: There were no vitals taken for this visit.  Physical Exam  Right Hand Exam   Tenderness  The patient is experiencing no  tenderness.   Other  Erythema: absent Sensation: normal Pulse: present  Comments:  Ring finger ROM 35/85 at PIP joint, small finger 10/90. No swelling.  No pain.      Specialty Comments:  No specialty comments available.  Imaging: No results found.   PMFS History: Patient Active Problem List   Diagnosis Date Noted   Closed displaced fracture of proximal phalanx of right ring finger 08/23/2021   Onychomycosis 04/17/2021   History reviewed. No pertinent past medical history.  Family History  Problem Relation Age of Onset   Dementia Mother    Hypertension Father    Breast cancer Neg Hx     Past Surgical History:  Procedure Laterality Date   ABDOMINAL HYSTERECTOMY     BREAST BIOPSY Left    No Scar    CLOSED REDUCTION FINGER WITH PERCUTANEOUS PINNING Right 08/29/2021   Procedure: CLOSED REDUCTION FINGER WITH PERCUTANEOUS PINNING RIGHT RING FINGER;  Surgeon: Marlyne Beards, MD;  Location: [facility];  Service: Orthopedics;  Laterality: Right;   Social History   Occupational History   Not on file  Tobacco Use   Smoking status: Never   Smokeless tobacco: Never  Substance and Sexual Activity   Alcohol use: No   Drug use: No   Sexual  activity: Not on file

## 2022-03-14 ENCOUNTER — Other Ambulatory Visit: Payer: Self-pay | Admitting: Family Medicine

## 2022-03-14 DIAGNOSIS — Z1231 Encounter for screening mammogram for malignant neoplasm of breast: Secondary | ICD-10-CM

## 2022-03-17 ENCOUNTER — Ambulatory Visit
Admission: RE | Admit: 2022-03-17 | Discharge: 2022-03-17 | Disposition: A | Discharge status: Home / Self Care | Payer: Medicare Other | Source: Ambulatory Visit | Attending: Family Medicine | Admitting: Family Medicine

## 2022-03-17 DIAGNOSIS — Z1231 Encounter for screening mammogram for malignant neoplasm of breast: Secondary | ICD-10-CM

## 2022-03-31 ENCOUNTER — Other Ambulatory Visit: Payer: Self-pay | Admitting: Podiatry

## 2022-08-30 ENCOUNTER — Encounter: Payer: Self-pay | Admitting: Emergency Medicine

## 2022-08-30 ENCOUNTER — Ambulatory Visit
Admission: EM | Admit: 2022-08-30 | Discharge: 2022-08-30 | Disposition: A | Discharge status: Home / Self Care | Payer: Medicare Other | Attending: Physician Assistant | Admitting: Physician Assistant

## 2022-08-30 DIAGNOSIS — H5789 Other specified disorders of eye and adnexa: Secondary | ICD-10-CM

## 2022-08-30 DIAGNOSIS — H00012 Hordeolum externum right lower eyelid: Secondary | ICD-10-CM | POA: Diagnosis not present

## 2022-08-30 MED ORDER — POLYMYXIN B-TRIMETHOPRIM 10000-0.1 UNIT/ML-% OP SOLN: 1.0000 [drp] | OPHTHALMIC | 0 refills | Status: AC

## 2022-08-30 MED ORDER — DOXYCYCLINE HYCLATE 100 MG PO CAPS: 100.0000 mg | ORAL_CAPSULE | Freq: Two times a day (BID) | ORAL | 0 refills | Status: AC

## 2022-08-30 NOTE — ED Triage Notes (Signed)
Pt said since Monday she has been having pain in her right eye with a stye. Pt said yesterday began to swell under her eye.

## 2022-08-30 NOTE — ED Provider Notes (Signed)
[facility]    CSN: [id number] Arrival date & time: 08/30/22  0949      History   Chief Complaint Chief Complaint  Patient presents with   Eye Pain    HPI Renee Rhodes is a 81 y.o. female.   Patient here today for evaluation of stye to her right lower eyelid that has been present for the last 5 days.  She reports that yesterday she started to have more swelling underneath her eye which concerned her and brought her in today.  She has not had any fever.  She does not know for sure if she has had any drainage from her right eye but she has been using warm compresses.  The history is provided by the patient.  Eye Pain Pertinent negatives include no shortness of breath.    History reviewed. No pertinent past medical history.  Patient Active Problem List   Diagnosis Date Noted   Closed displaced fracture of proximal phalanx of right ring finger 08/23/2021   Onychomycosis 04/17/2021    Past Surgical History:  Procedure Laterality Date   ABDOMINAL HYSTERECTOMY     BREAST BIOPSY Left    No Scar    CLOSED REDUCTION FINGER WITH PERCUTANEOUS PINNING Right 08/29/2021   Procedure: CLOSED REDUCTION FINGER WITH PERCUTANEOUS PINNING RIGHT RING FINGER;  Surgeon: Sherilyn Cooter, MD;  Location: [facility];  Service: Orthopedics;  Laterality: Right;    OB History   No obstetric history on file.      Home Medications    Prior to Admission medications   Medication Sig Start Date End Date Taking? Authorizing Provider  doxycycline (VIBRAMYCIN) 100 MG capsule Take 1 capsule (100 mg total) by mouth 2 (two) times daily. 08/30/22  Yes Francene Finders, PA-C  trimethoprim-polymyxin b (POLYTRIM) ophthalmic solution Place 1 drop into the right eye every 4 (four) hours for 7 days. 08/30/22 09/06/22 Yes Francene Finders, PA-C  Acetaminophen (TYLENOL) 325 MG CAPS Take by mouth.    [provider]  ALPHAGAN P 0.1 % SOLN  03/29/20   [provider]  atorvastatin (LIPITOR) 20 MG tablet Take 20 mg by mouth daily.    [provider]  calcium carbonate (OS-CAL) 1250 (500 CA) MG chewable tablet Chew 1 tablet by mouth daily.    [provider]  dorzolamide (TRUSOPT) 2 % ophthalmic solution  12/06/19   [provider]  Efinaconazole 10 % SOLN Apply 1 drop topically daily. 04/15/21   Trula Slade, DPM  Efinaconazole 10 % SOLN Apply 1 drop topically daily. 04/15/21   Trula Slade, DPM  ibuprofen (ADVIL,MOTRIN) 800 MG tablet Take 1 tablet (800 mg total) by mouth every 8 (eight) hours as needed. 01/02/15   Timmothy Euler, MD  NONFORMULARY OR COMPOUNDED ITEM [facility]:  Antifungal topical - Terbinafine 3%, Fluconazole 2%, Tea Tree Oil 5%, Ibuprofen 2% in DMSO Suspension #29ml. Apply to the affected toenail(s) once (at bedtime) or twice daily. 05/08/20   Trula Slade, DPM  omeprazole (PRILOSEC) 10 MG capsule Take 10 mg by mouth daily.    [provider]  SIMBRINZA 1-0.2 % SUSP Instill 1 drop into both eyes twice a day 03/13/20   [provider]  terbinafine (LAMISIL) 250 MG tablet Take 1 tablet (250 mg total) by mouth daily. 05/01/20   Trula Slade, DPM  traMADol (ULTRAM) 50 MG tablet Take 1 tablet (50 mg total) by mouth every 6 (six) hours as needed.  08/21/21   Wallis Bamberg, PA-C    Family History Family History  Problem Relation Age of Onset   Dementia Mother    Hypertension Father    Breast cancer Neg Hx     Social History Social History   Tobacco Use   Smoking status: Never   Smokeless tobacco: Never  Substance Use Topics   Alcohol use: No   Drug use: No     Allergies   No known allergies   Review of Systems Review of Systems  Constitutional:  Negative for chills and fever.  HENT:  Positive for facial swelling. Negative for congestion.   Eyes:  Positive for pain and redness (eyelid, no actual eye). Negative for discharge.  Respiratory:   Negative for shortness of breath.      Physical Exam Triage Vital Signs ED Triage Vitals  Enc Vitals Group     BP 08/30/22 1056 (!) 160/87     Pulse Rate 08/30/22 1056 63     Resp 08/30/22 1056 16     Temp 08/30/22 1056 98.6 F (37 C)     Temp Source 08/30/22 1056 Oral     SpO2 08/30/22 1056 98 %     Weight --      Height --      Head Circumference --      Peak Flow --      Pain Score 08/30/22 1055 3     Pain Loc --      Pain Edu? --      Excl. in GC? --    No data found.  Updated Vital Signs BP (!) 160/87 (BP Location: Left Arm)   Pulse 63   Temp 98.6 F (37 C) (Oral)   Resp 16   SpO2 98%      Physical Exam Vitals and nursing note reviewed.  Constitutional:      General: She is not in acute distress.    Appearance: Normal appearance. She is not ill-appearing.  HENT:     Head: Normocephalic and atraumatic.  Eyes:     Conjunctiva/sclera: Conjunctivae normal.     Comments: Swelling and erythema noted to right lower lid with swelling and erythema noted below eye  Cardiovascular:     Rate and Rhythm: Normal rate.  Pulmonary:     Effort: Pulmonary effort is normal. No respiratory distress.  Neurological:     Mental Status: She is alert.  Psychiatric:        Mood and Affect: Mood normal.        Behavior: Behavior normal.      UC Treatments / Results  Labs (all labs ordered are listed, but only abnormal results are displayed) Labs Reviewed - No data to display  EKG   Radiology No results found.  Procedures Procedures (including critical care time)  Medications Ordered in UC Medications - No data to display  Initial Impression / Assessment and Plan / UC Course  I have reviewed the triage vital signs and the nursing notes.  Pertinent labs & imaging results that were available during my care of the patient were reviewed by me and considered in my medical decision making (see chart for details).   Given swelling beneath the eye will treat with oral  antibiotics as well as ophthalmic antibiotic drops. Encouraged alternating warm and cold compresses and follow up with any further concerns.    Final Clinical Impressions(s) / UC Diagnoses   Final diagnoses:  Hordeolum externum of right lower eyelid  Periorbital  swelling   Discharge Instructions   None    ED Prescriptions     Medication Sig Dispense Auth. Provider   doxycycline (VIBRAMYCIN) 100 MG capsule Take 1 capsule (100 mg total) by mouth 2 (two) times daily. 20 capsule Tomi Bamberger, PA-C   trimethoprim-polymyxin b (POLYTRIM) ophthalmic solution Place 1 drop into the right eye every 4 (four) hours for 7 days. 10 mL Tomi Bamberger, PA-C      PDMP not reviewed this encounter.   Tomi Bamberger, PA-C 08/30/22 1446

## 2022-11-13 ENCOUNTER — Other Ambulatory Visit: Payer: Self-pay | Admitting: Internal Medicine

## 2022-11-13 DIAGNOSIS — M810 Age-related osteoporosis without current pathological fracture: Secondary | ICD-10-CM

## 2022-12-01 HISTORY — PX: BREAST CYST ASPIRATION: SHX578

## 2023-03-19 ENCOUNTER — Other Ambulatory Visit: Payer: Self-pay | Admitting: Internal Medicine

## 2023-03-19 DIAGNOSIS — Z1231 Encounter for screening mammogram for malignant neoplasm of breast: Secondary | ICD-10-CM

## 2023-04-29 ENCOUNTER — Ambulatory Visit
Admission: RE | Admit: 2023-04-29 | Discharge: 2023-04-29 | Disposition: A | Discharge status: Home / Self Care | Payer: Medicare Other | Source: Ambulatory Visit | Attending: Internal Medicine | Admitting: Internal Medicine

## 2023-04-29 DIAGNOSIS — Z1231 Encounter for screening mammogram for malignant neoplasm of breast: Secondary | ICD-10-CM

## 2023-05-01 ENCOUNTER — Other Ambulatory Visit: Payer: Self-pay | Admitting: Internal Medicine

## 2023-05-01 DIAGNOSIS — R928 Other abnormal and inconclusive findings on diagnostic imaging of breast: Secondary | ICD-10-CM

## 2023-05-06 ENCOUNTER — Other Ambulatory Visit: Payer: Self-pay | Admitting: Internal Medicine

## 2023-05-06 ENCOUNTER — Ambulatory Visit
Admission: RE | Admit: 2023-05-06 | Discharge: 2023-05-06 | Disposition: A | Discharge status: Home / Self Care | Payer: Medicare Other | Source: Ambulatory Visit | Attending: Internal Medicine | Admitting: Internal Medicine

## 2023-05-06 DIAGNOSIS — R928 Other abnormal and inconclusive findings on diagnostic imaging of breast: Secondary | ICD-10-CM

## 2023-05-13 ENCOUNTER — Ambulatory Visit
Admission: RE | Admit: 2023-05-13 | Discharge: 2023-05-13 | Disposition: A | Discharge status: Home / Self Care | Payer: Medicare Other | Source: Ambulatory Visit | Attending: Internal Medicine | Admitting: Internal Medicine

## 2023-05-13 DIAGNOSIS — M810 Age-related osteoporosis without current pathological fracture: Secondary | ICD-10-CM

## 2023-05-13 DIAGNOSIS — R928 Other abnormal and inconclusive findings on diagnostic imaging of breast: Secondary | ICD-10-CM

## 2023-11-01 IMAGING — MG MM DIGITAL SCREENING BILAT W/ TOMO AND CAD
6 of 12 series · 6 of 36 positions shown · non-contrast
Comparison: Previous exam(s).

CLINICAL DATA: Screening.

EXAM:
DIGITAL SCREENING BILATERAL MAMMOGRAM WITH TOMOSYNTHESIS AND CAD
TECHNIQUE: Bilateral screening digital craniocaudal and mediolateral oblique
mammograms were obtained. Bilateral screening digital breast
tomosynthesis was performed. The images were evaluated with
computer-aided detection.

[R MLO synth-2D (1 of 2)]
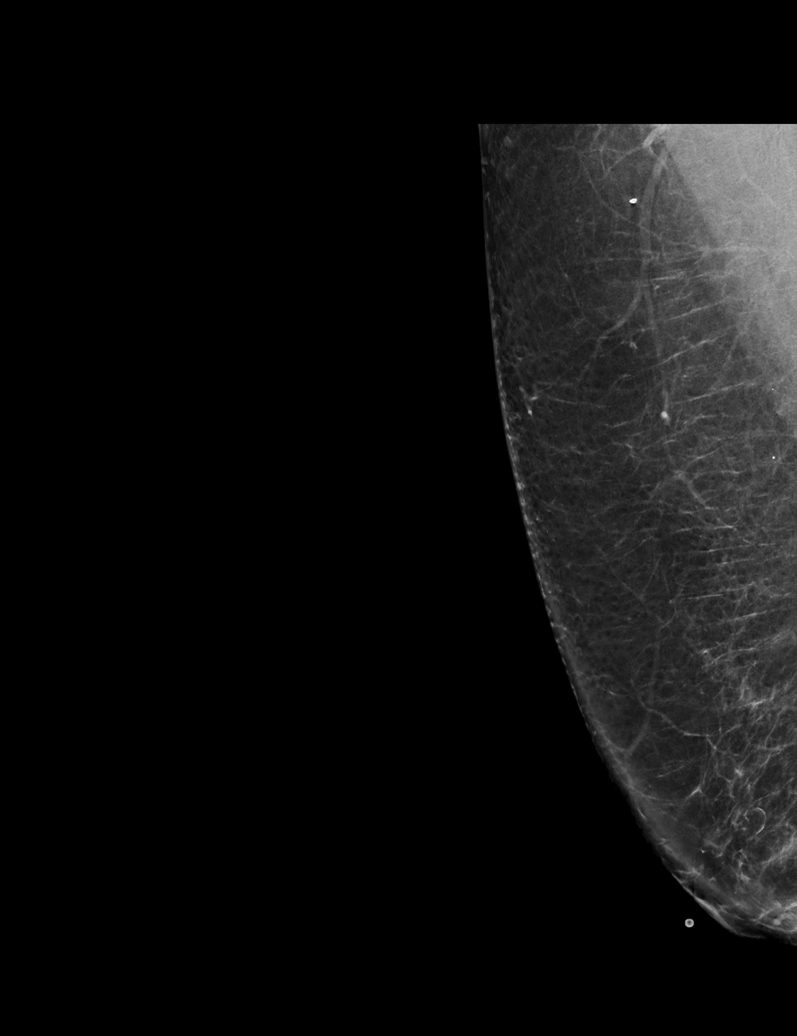

[R CC synth-2D]
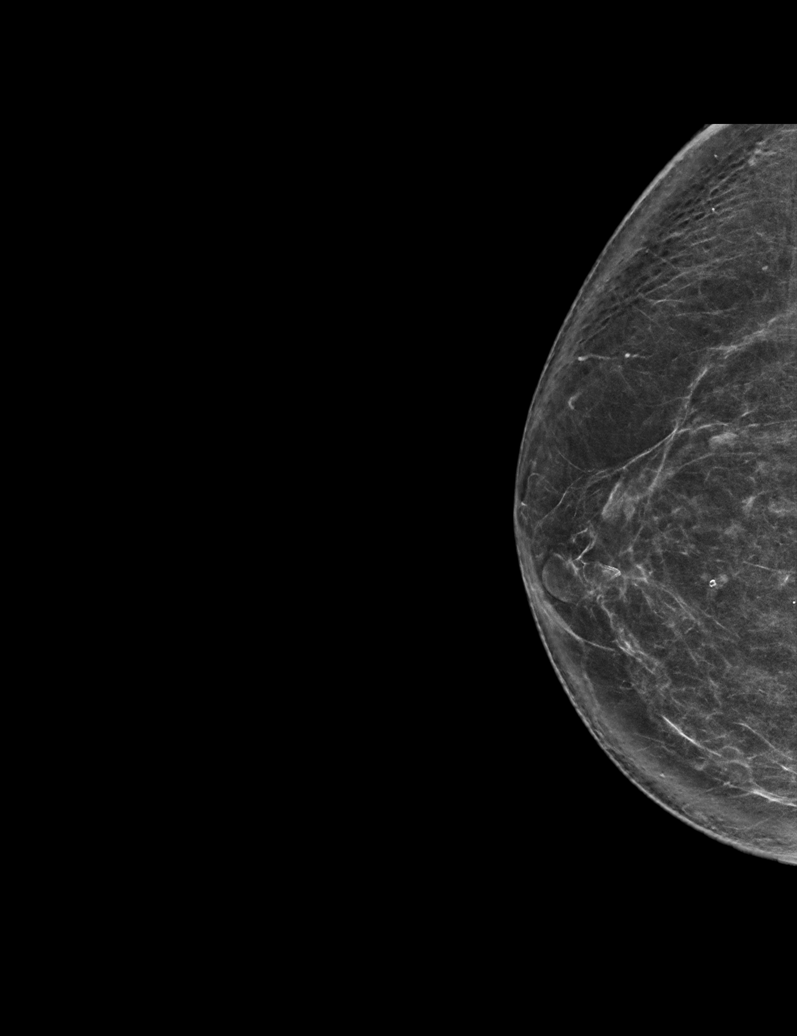

[L MLO synth-2D (1 of 2)]
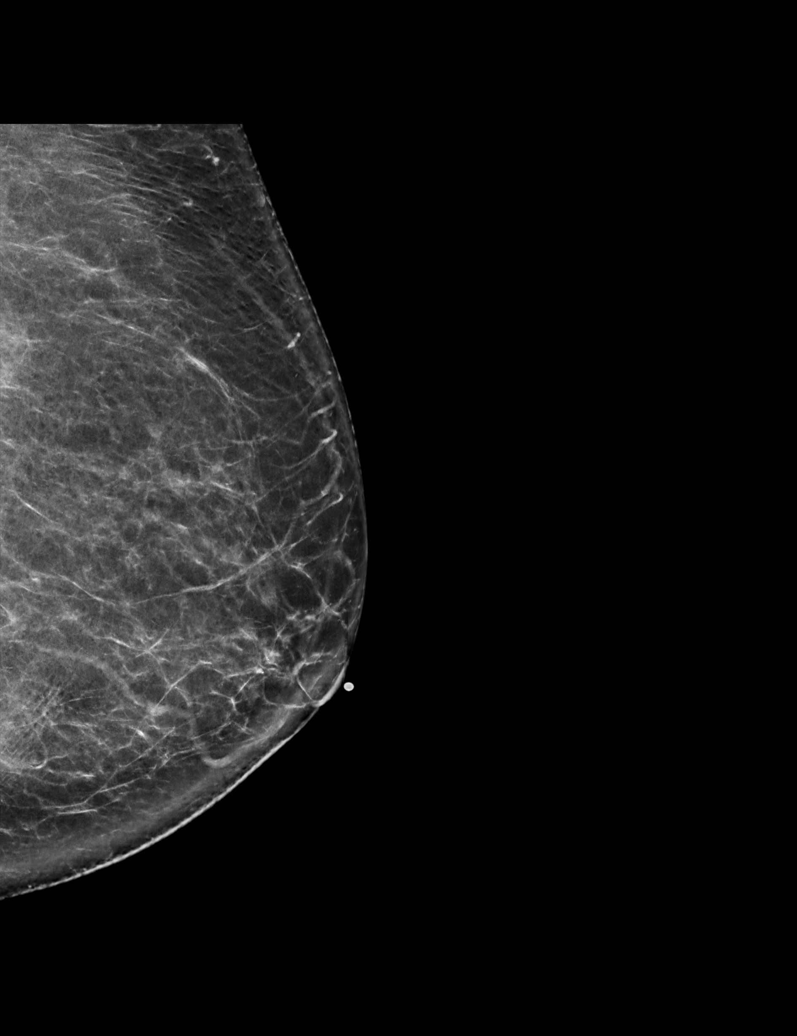

[R MLO synth-2D (2 of 2)]
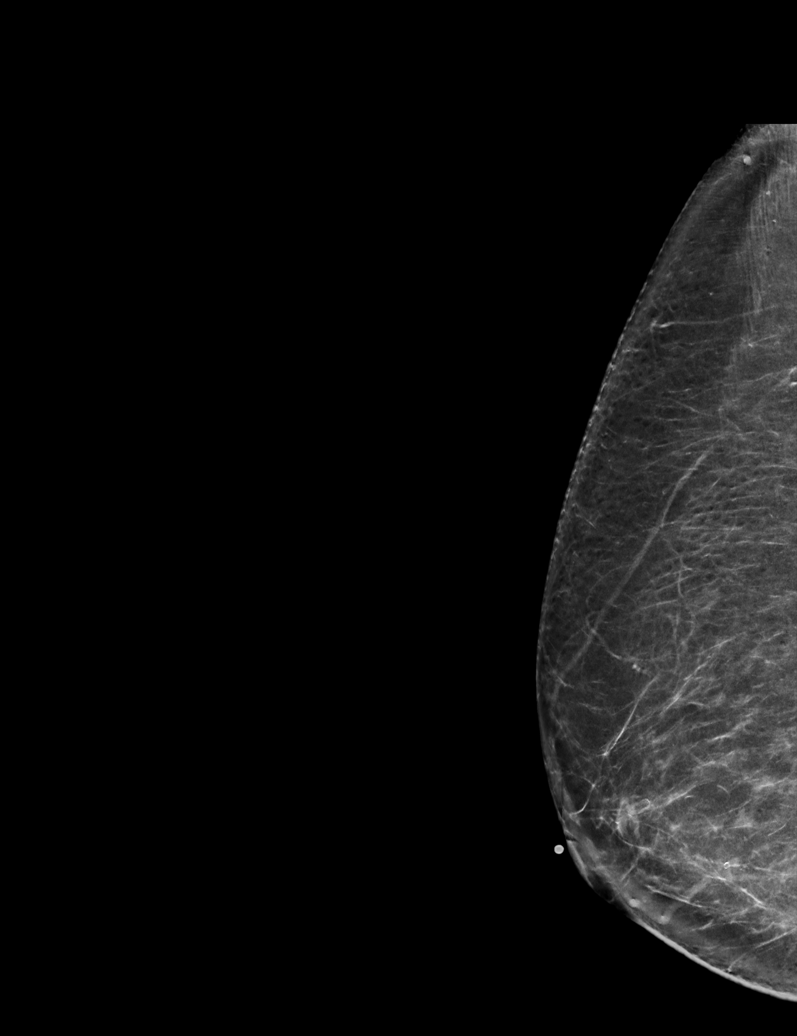

[L MLO synth-2D (2 of 2)]
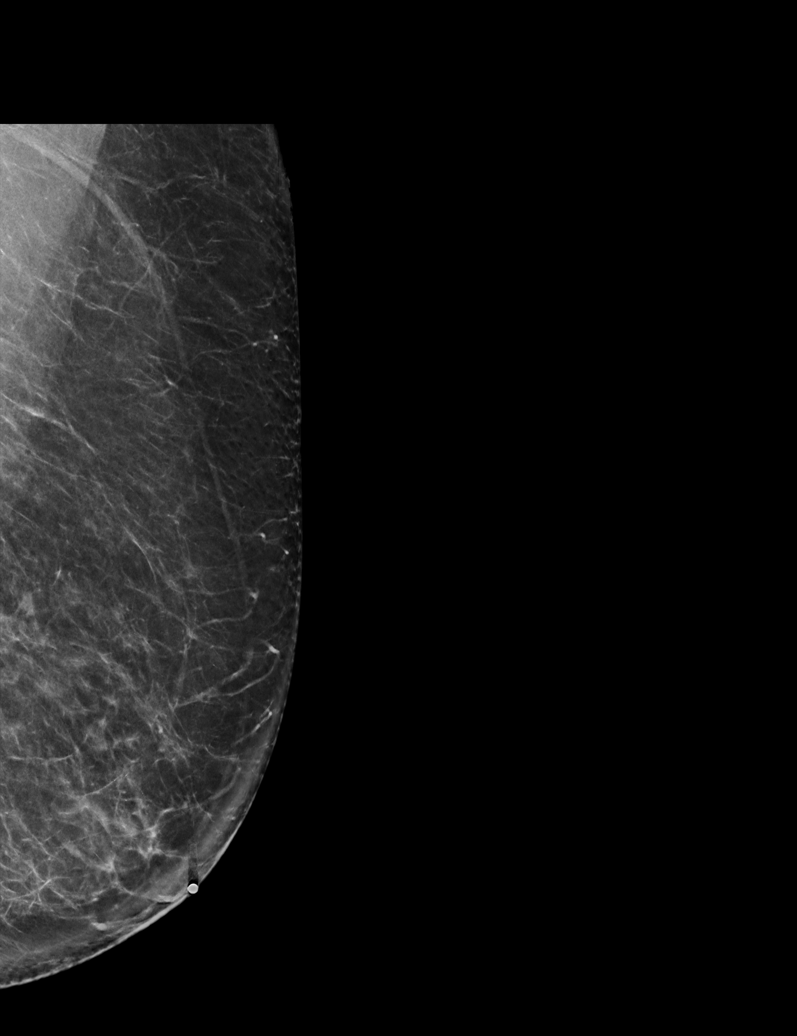

[L CC synth-2D]
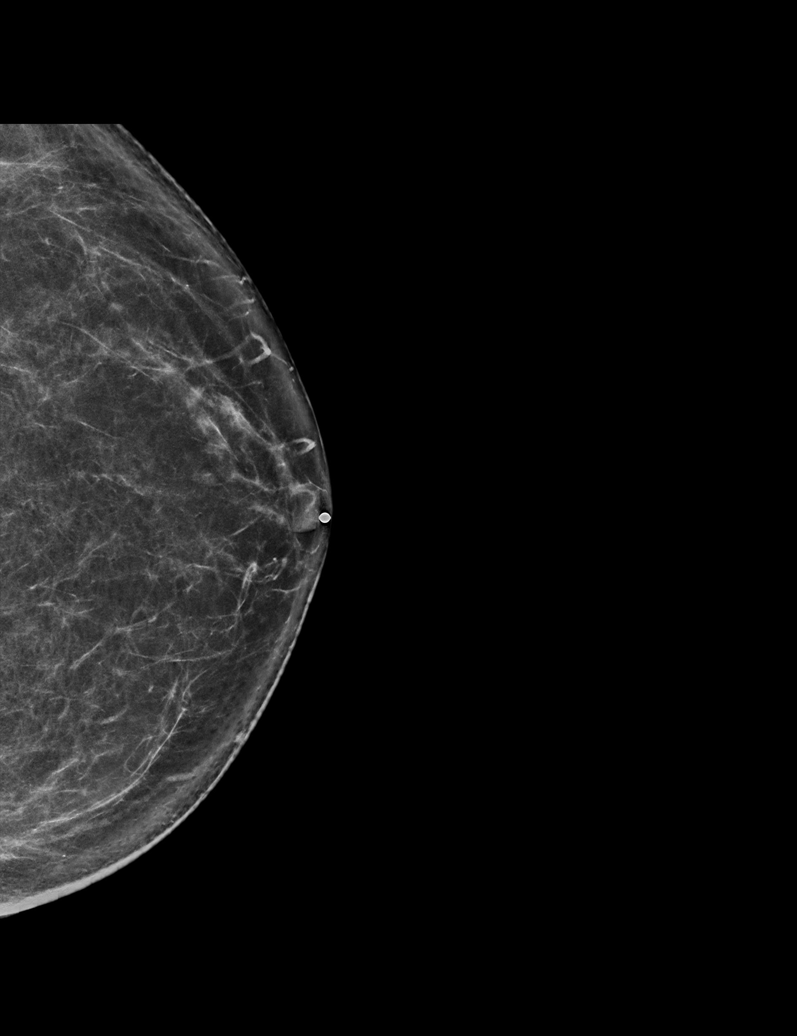

[6 of 36 positions shown; findings below may reference images not displayed]

ACR Breast Density Category b: There are scattered areas of
fibroglandular density.
FINDINGS: There are no findings suspicious for malignancy.
IMPRESSION: No mammographic evidence of malignancy. A result letter of this
screening mammogram will be mailed directly to the patient.

RECOMMENDATION:
Screening mammogram in one year. (Code:51-O-LD2)

BI-RADS CATEGORY  1: Negative.

## 2024-04-22 ENCOUNTER — Other Ambulatory Visit: Payer: Self-pay | Admitting: Internal Medicine

## 2024-04-22 DIAGNOSIS — Z1231 Encounter for screening mammogram for malignant neoplasm of breast: Secondary | ICD-10-CM

## 2024-05-02 ENCOUNTER — Ambulatory Visit

## 2024-05-17 ENCOUNTER — Ambulatory Visit
Admission: RE | Admit: 2024-05-17 | Discharge: 2024-05-17 | Disposition: A | Discharge status: Home / Self Care | Source: Ambulatory Visit | Attending: Internal Medicine | Admitting: Internal Medicine

## 2024-05-17 DIAGNOSIS — Z1231 Encounter for screening mammogram for malignant neoplasm of breast: Secondary | ICD-10-CM

## 2025-01-03 ENCOUNTER — Other Ambulatory Visit: Payer: Self-pay | Admitting: Internal Medicine

## 2025-01-03 DIAGNOSIS — R600 Localized edema: Secondary | ICD-10-CM

## 2025-01-09 ENCOUNTER — Other Ambulatory Visit
